# Patient Record
Sex: Male | Born: 1960 | Race: White | Hispanic: No | Marital: Married | Smoking: Former smoker
Health system: Southern US, Community
[De-identification: ages and names within clinical notes are randomized; demographics above are authoritative.]

## PROBLEM LIST (undated history)

## (undated) DIAGNOSIS — M1990 Unspecified osteoarthritis, unspecified site: Secondary | ICD-10-CM

## (undated) DIAGNOSIS — Z87442 Personal history of urinary calculi: Secondary | ICD-10-CM

## (undated) HISTORY — PX: TONSILLECTOMY: SUR1361

---

## 2011-05-10 ENCOUNTER — Telehealth: Payer: Self-pay | Admitting: Family Medicine

## 2011-05-10 NOTE — Telephone Encounter (Signed)
Pt would like to pup lab orders that have been printed out and take to lab in [place] to have drawn.  Please advise what labs need to be ordered, and will do. Plan:  Routed to Dr. Marlyne Beards, LPN [facility]

## 2011-05-10 NOTE — Telephone Encounter (Signed)
This pt has never been seen here.

## 2011-05-11 NOTE — Telephone Encounter (Signed)
LMOM for the pt instructing wife to call and let know what the request for labs is for.  Pt never seen in this office before.  No appts on file. Pending pt call back. Jarvis Newcomer, LPN [facility]

## 2011-05-12 NOTE — Telephone Encounter (Signed)
Closed

## 2012-03-15 ENCOUNTER — Ambulatory Visit: Payer: BC Managed Care – PPO | Admitting: Physician Assistant

## 2012-03-16 ENCOUNTER — Ambulatory Visit (INDEPENDENT_AMBULATORY_CARE_PROVIDER_SITE_OTHER): Payer: PRIVATE HEALTH INSURANCE | Admitting: Family Medicine

## 2012-03-16 ENCOUNTER — Encounter: Payer: Self-pay | Admitting: Family Medicine

## 2012-03-16 VITALS — BP 123/85 | HR 86 | Temp 98.2°F | Ht 74.0 in | Wt 265.0 lb

## 2012-03-16 DIAGNOSIS — R1310 Dysphagia, unspecified: Secondary | ICD-10-CM

## 2012-03-16 DIAGNOSIS — J029 Acute pharyngitis, unspecified: Secondary | ICD-10-CM

## 2012-03-16 DIAGNOSIS — D239 Other benign neoplasm of skin, unspecified: Secondary | ICD-10-CM

## 2012-03-16 LAB — POCT RAPID STREP A (OFFICE): Rapid Strep A Screen: NEGATIVE

## 2012-03-16 NOTE — Patient Instructions (Addendum)
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 If you don't here from [place] in about a week then please give [place] a call at [phone]. Take Dexilant once a day for a week or two and see if helps your symptoms or not.

## 2012-03-16 NOTE — Progress Notes (Signed)
Subjective:    Patient ID: Elijah Vargas, male    DOB: [birth year], 51 y.o.   MRN: [id number]  HPI ST x 3 months.  Constantly feels raw.  Food feels like it might get stuck.  Sometimes coughs and sometimes chokes.  No other dysphagia lower down. No hx fo throat or GI problems. No sick contacts. Wife had strep.  No fever.  Right ear had pain for a little while. But recenly had tooth pulled and no longer having ear pain. No lozenges or medicine.  No voice changes. Not a smoker.  No swelling in the neck.  No rash or fatigue. No recent heartburn symptoms. Doesn't work  Where has to talk all day or scream all days.   Lesion on arm, right - has been there for years. No change. Occ tender if squeeze it. No drainage.     Review of Systems  Constitutional: Negative for fever, diaphoresis and unexpected weight change.  HENT: Negative for hearing loss, rhinorrhea, sneezing and tinnitus.   Eyes: Negative for visual disturbance.  Respiratory: Negative for cough and wheezing.   Cardiovascular: Negative for chest pain and palpitations.  Gastrointestinal: Negative for nausea, vomiting, diarrhea and blood in stool.  Genitourinary: Negative for dysuria and discharge.  Musculoskeletal: Negative for myalgias and arthralgias.  Skin: Negative for rash.  Neurological: Negative for headaches.  Hematological: Negative for adenopathy.  Psychiatric/Behavioral: Negative for disturbed wake/sleep cycle and dysphoric mood. The patient is not nervous/anxious.    BP 123/85  Pulse 86  Temp 98.2 F (36.8 C) (Oral)  Ht 6\' 2"  (1.88 m)  Wt 265 lb (120.203 kg)  BMI 34.02 kg/m2  SpO2 97%    No Known Allergies  History reviewed. No pertinent past medical history.  Past Surgical History  Procedure Date  . Tonsillectomy     History   Social History  . Marital Status: Married    Spouse Name: Nita Sells    Number of Children: 3  . Years of Education: HS   Occupational History  . dock worker     [employer]    Social  History Main Topics  . Smoking status: Former Smoker    Quit date: 03/16/1992  . Smokeless tobacco: Not on file  . Alcohol Use: Not on file  . Drug Use: No  . Sexually Active: Yes -- Male partner(s)   Other Topics Concern  . Not on file   Social History Narrative  . No narrative on file    Family History  Problem Relation Age of Onset  . Breast cancer      grandmother  . Diabetes Mother   . Hypertension Father   . Alcohol abuse Brother     No outpatient encounter prescriptions on file as of 03/16/2012.          Objective:   Physical Exam  Constitutional: He is oriented to person, place, and time. He appears well-developed and well-nourished.  HENT:  Head: Normocephalic and atraumatic.  Right Ear: External ear normal.  Left Ear: External ear normal.  Nose: Nose normal.  Mouth/Throat: Oropharynx is clear and moist. No oropharyngeal exudate.       TMs and canals are clear. Tonsils are absent.   Eyes: Conjunctivae and EOM are normal. Pupils are equal, round, and reactive to light.  Neck: Neck supple. No thyromegaly present.  Cardiovascular: Normal rate and normal heart sounds.   Pulmonary/Chest: Effort normal and breath sounds normal.  Lymphadenopathy:    He has no cervical adenopathy.  Neurological:  He is alert and oriented to person, place, and time.  Skin: Skin is warm and dry.  Psychiatric: He has a normal mood and affect.          Assessment & Plan:  Pharyngitis - Neg for strep. Will check for CMV and EBV and CBC w/ diff. Remote smoking history. Could be a stricture or esophagitis as well since occ having dysphagia. Also consider tx for GERD with can cause ST and painful swallowing. I am concerned about the dysphasia and occasional choking.  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 Recommend benefit for him.  He says he would like to come in for physical. Impression schedule it at any point in time.  And we can certainly get blood work et Karie Soda.

## 2012-09-25 ENCOUNTER — Encounter: Payer: Self-pay | Admitting: Family Medicine

## 2012-09-25 ENCOUNTER — Ambulatory Visit (INDEPENDENT_AMBULATORY_CARE_PROVIDER_SITE_OTHER): Payer: BC Managed Care – PPO | Admitting: Family Medicine

## 2012-09-25 VITALS — BP 123/81 | HR 104 | Ht 74.0 in | Wt 271.0 lb

## 2012-09-25 DIAGNOSIS — R300 Dysuria: Secondary | ICD-10-CM

## 2012-09-25 DIAGNOSIS — R3129 Other microscopic hematuria: Secondary | ICD-10-CM

## 2012-09-25 LAB — POCT URINALYSIS DIPSTICK
Ketones, UA: NEGATIVE
Leukocytes, UA: NEGATIVE
Nitrite, UA: NEGATIVE
Protein, UA: NEGATIVE
Urobilinogen, UA: 0.2
pH, UA: 5.5

## 2012-09-25 NOTE — Patient Instructions (Addendum)
Will call you with the results

## 2012-09-25 NOTE — Progress Notes (Signed)
  Subjective:    Patient ID: Elijah Vargas, male    DOB: June 23, 1961, 52 y.o.   MRN: [id number]  HPI Microscopy hematuria. - No prior problems with kidneys.  Was told had blood in the urine a few years ago but not heard that before. Not a smoker. Quit 21 years ago.  He is not having any pains or abdominal discomfort or dysuria. No gross hematuria. No trauma. No family history of kidney problems.   Review of Systems     Objective:   Physical Exam  Constitutional: He is oriented to person, place, and time. He appears well-developed and well-nourished.  HENT:  Head: Normocephalic and atraumatic.  Cardiovascular: Normal rate, regular rhythm and normal heart sounds.   Pulmonary/Chest: Effort normal and breath sounds normal.  Abdominal: Soft. Bowel sounds are normal. He exhibits no distension and no mass. There is no tenderness. There is no rebound and no guarding.  Neurological: He is alert and oriented to person, place, and time.  Skin: Skin is warm and dry.  Psychiatric: He has a normal mood and affect. His behavior is normal.          Assessment & Plan:  Microscopic hematuria - unclear etiology at this point. Urinalysis was positive for small amount of blood. We'll send for microscopic evaluation to actually count the number of red blood cells in addition to urine culture. We will call with those results are available. They were to proceed with additional workup for hematuria.

## 2012-09-28 LAB — URINALYSIS, MICROSCOPIC ONLY
Bacteria, UA: NONE SEEN
Crystals: NONE SEEN

## 2012-09-29 LAB — URINE CULTURE: Colony Count: NO GROWTH

## 2013-12-14 ENCOUNTER — Emergency Department
Admission: EM | Admit: 2013-12-14 | Discharge: 2013-12-14 | Discharge status: Absent without leave | Payer: BC Managed Care – PPO | Source: Home / Self Care

## 2013-12-14 NOTE — ED Notes (Signed)
Foreign object in eye fell out before triage. Pt decided he did not need to be seen.

## 2014-04-15 ENCOUNTER — Ambulatory Visit (INDEPENDENT_AMBULATORY_CARE_PROVIDER_SITE_OTHER): Payer: BC Managed Care – PPO

## 2014-04-15 ENCOUNTER — Ambulatory Visit (INDEPENDENT_AMBULATORY_CARE_PROVIDER_SITE_OTHER): Payer: BC Managed Care – PPO | Admitting: Family Medicine

## 2014-04-15 ENCOUNTER — Encounter: Payer: Self-pay | Admitting: Family Medicine

## 2014-04-15 VITALS — BP 134/91 | HR 77 | Ht 74.0 in | Wt 271.0 lb

## 2014-04-15 DIAGNOSIS — M47817 Spondylosis without myelopathy or radiculopathy, lumbosacral region: Secondary | ICD-10-CM

## 2014-04-15 DIAGNOSIS — R5383 Other fatigue: Principal | ICD-10-CM

## 2014-04-15 DIAGNOSIS — M25559 Pain in unspecified hip: Secondary | ICD-10-CM

## 2014-04-15 DIAGNOSIS — R5381 Other malaise: Secondary | ICD-10-CM

## 2014-04-15 DIAGNOSIS — M545 Low back pain, unspecified: Secondary | ICD-10-CM

## 2014-04-15 DIAGNOSIS — Z125 Encounter for screening for malignant neoplasm of prostate: Secondary | ICD-10-CM

## 2014-04-15 MED ORDER — MELOXICAM 15 MG PO TABS: 15.0000 mg | ORAL_TABLET | Freq: Every day | ORAL | Status: DC

## 2014-04-15 MED ORDER — CYCLOBENZAPRINE HCL 10 MG PO TABS: 10.0000 mg | ORAL_TABLET | Freq: Three times a day (TID) | ORAL | Status: DC | PRN

## 2014-04-15 NOTE — Progress Notes (Signed)
Subjective:    Patient ID: Elijah Vargas, male    DOB: 08-07-1961, 53 y.o.   MRN: [id number]  Back Pain  Hip Pain    Back pain started about 1 mo ago. Pain 3-4/10, sharp and constant.  Mostly on his left side. 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  Using Tylenol and IBU for pain relief. No ice or heat.  Has been having some let hip pain for several months. At its worst is is 8/10.  No pain with urination.  No radiation into the leg.  His hip used to lock and pop. No old injuries.    He is fasting today.    His wife also requests that he have some routine blood work including cholesterol etc. She would also like to have his testosterone checked because she says she's been very fatigued and she feels like he's noticed a significant decrease in his strength of the last year.   Review of Systems  Musculoskeletal: Positive for back pain.   BP 134/91  Pulse 77  Ht 6\' 2"  (1.88 m)  Wt 271 lb (122.925 kg)  BMI 34.78 kg/m2    No Known Allergies  No past medical history on file.  Past Surgical History  Procedure Laterality Date  . Tonsillectomy      History   Social History  . Marital Status: Married    Spouse Name: Velta Addison    Number of Children: 3  . Years of Education: HS   Occupational History  . dock worker     [employer]    Social History Main Topics  . Smoking status: Former Smoker    Quit date: 03/16/1992  . Smokeless tobacco: Not on file  . Alcohol Use: Not on file  . Drug Use: No  . Sexual Activity: Yes    Partners: Female   Other Topics Concern  . Not on file   Social History Narrative  . No narrative on file    Family History  Problem Relation Age of Onset  . Breast cancer      grandmother  . Diabetes Mother   . Hypertension Father   . Alcohol abuse Brother     Outpatient Encounter Prescriptions as of 04/15/2014  Medication Sig  . cyclobenzaprine (FLEXERIL) 10 MG tablet Take 1 tablet (10 mg total) by mouth 3 (three) times daily as needed for  muscle spasms.  . meloxicam (MOBIC) 15 MG tablet Take 1 tablet (15 mg total) by mouth daily.          Objective:   Physical Exam  Constitutional: He is oriented to person, place, and time. He appears well-developed and well-nourished.  HENT:  Head: Normocephalic and atraumatic.  Cardiovascular: Normal rate, regular rhythm and normal heart sounds.   Pulmonary/Chest: Effort normal and breath sounds normal.  Musculoskeletal:  Lumbar spine is nontender but I am able to palpate a spasm of the paraspinous muscles on the left side. A little bit of SI joint tenderness as well. Normal flexion, extension, rotation right and left. He had slight decreased rotation with side bending to the left because of discomfort. Negative straight leg raise bilaterally. Hip, knee, ankle strength out of 5 bilaterally. Patellar reflexes are 2+. Nontender over the greater trochanter bilaterally. Is a little bit weaker with hip abduction against resistance on the left compared to the right. No significant discomfort with internal or external rotation of the hips.  Neurological: He is alert and oriented to person, place, and time.  Skin:  Skin is warm and dry.  Psychiatric: He has a normal mood and affect. His behavior is normal.          Assessment & Plan:  Bilateral hip pain- unclear etiology but suspect possibly a labral tear based on his history and symptoms. Is unable to reproduce his pain today. I do not think it's bursitis there he does say case he has pain in the hips with sleeping on his side. Recommend an anti-inflammatory, heat/ice, and we'll get x-rays of the hip pain is been going on for months and he actually gets walking.  Low back pain- most consistant with MSK strain.  Will treat with a heating pad, NSAID, and muscle relaxer.  Handout provided for home stretches.    Due for screening lipid and CMP-strongly encouraged him to come back for complete physical.  Fatigue-unclear etiology. We did not go  into a lot of detail about this today. Since he was an acute slot. He did score positive for 5/10 questions for low testosterone and would like to have this checked today.  Declined flu vaccine today.

## 2014-04-16 LAB — CBC
HEMATOCRIT: 48 % (ref 39.0–52.0)
Hemoglobin: 16.5 g/dL (ref 13.0–17.0)
MCH: 29.3 pg (ref 26.0–34.0)
MCHC: 34.4 g/dL (ref 30.0–36.0)
MCV: 85.1 fL (ref 78.0–100.0)
Platelets: 270 10*3/uL (ref 150–400)
RBC: 5.64 MIL/uL (ref 4.22–5.81)
RDW: 14.5 % (ref 11.5–15.5)
WBC: 7.7 10*3/uL (ref 4.0–10.5)

## 2014-04-16 LAB — COMPLETE METABOLIC PANEL WITH GFR
ALBUMIN: 4.6 g/dL (ref 3.5–5.2)
ALK PHOS: 60 U/L (ref 39–117)
ALT: 27 U/L (ref 0–53)
AST: 31 U/L (ref 0–37)
BUN: 16 mg/dL (ref 6–23)
CALCIUM: 9 mg/dL (ref 8.4–10.5)
CO2: 26 mEq/L (ref 19–32)
Chloride: 101 mEq/L (ref 96–112)
Creat: 0.97 mg/dL (ref 0.50–1.35)
GFR, EST NON AFRICAN AMERICAN: 89 mL/min
GFR, Est African American: >89 mL/min
GLUCOSE: 90 mg/dL (ref 70–99)
POTASSIUM: 4.1 meq/L (ref 3.5–5.3)
Sodium: 135 mEq/L (ref 135–145)
Total Bilirubin: 0.7 mg/dL (ref 0.2–1.2)
Total Protein: 7.2 g/dL (ref 6.0–8.3)

## 2014-04-16 LAB — TESTOSTERONE, FREE, TOTAL, SHBG
Sex Hormone Binding: 20 nmol/L (ref 13–71)
TESTOSTERONE FREE: 54.6 pg/mL (ref 47.0–244.0)
Testosterone-% Free: 2.5 % (ref 1.6–2.9)
Testosterone: 220 ng/dL — ABNORMAL LOW (ref 300–890)

## 2014-04-16 LAB — LIPID PANEL
Cholesterol: 221 mg/dL — ABNORMAL HIGH (ref 0–200)
HDL: 43 mg/dL (ref >39)
LDL CALC: 123 mg/dL — AB (ref 0–99)
Total CHOL/HDL Ratio: 5.1 Ratio
Triglycerides: 273 mg/dL — ABNORMAL HIGH (ref <150)
VLDL: 55 mg/dL — AB (ref 0–40)

## 2014-04-16 LAB — TSH: TSH: 2.174 u[IU]/mL (ref 0.350–4.500)

## 2014-04-16 LAB — PSA: PSA: 0.31 ng/mL (ref <=4.00)

## 2014-06-06 ENCOUNTER — Other Ambulatory Visit: Payer: Self-pay | Admitting: Family Medicine

## 2014-06-06 DIAGNOSIS — R7989 Other specified abnormal findings of blood chemistry: Secondary | ICD-10-CM

## 2014-06-07 LAB — TESTOSTERONE, FREE, TOTAL, SHBG
Sex Hormone Binding: 21 nmol/L (ref 13–71)
Testosterone, Free: 42.9 pg/mL — ABNORMAL LOW (ref 47.0–244.0)
Testosterone-% Free: 2.4 % (ref 1.6–2.9)
Testosterone: 179 ng/dL — ABNORMAL LOW (ref 300–890)

## 2016-12-10 ENCOUNTER — Telehealth: Payer: Self-pay | Admitting: *Deleted

## 2016-12-10 DIAGNOSIS — Z125 Encounter for screening for malignant neoplasm of prostate: Secondary | ICD-10-CM

## 2016-12-10 DIAGNOSIS — Z0000 Encounter for general adult medical examination without abnormal findings: Secondary | ICD-10-CM

## 2016-12-10 NOTE — Telephone Encounter (Signed)
Pt's wife called and stated that she had spoken w/Dr. Madilyn Fireman at her [facility] and asked if she would order his labs so that he would not have to take so much time off work going back and forth. Dr. Madilyn Fireman told her that she would do this for him.  She said that he showed up at the lab this morning and they don't have is labs. So he came upstairs and spoke w/someone up front she doesn't know who and was told that Dr. Madilyn Fireman would not release the labs. I called her back and lvm apologizing for this and told her that I would get this resolved for her. Maryruth Eve, Lahoma Crocker

## 2016-12-13 LAB — COMPREHENSIVE METABOLIC PANEL
AG Ratio: 1.5 Ratio (ref 1.0–2.5)
ALBUMIN: 4.4 g/dL (ref 3.6–5.1)
ALT: 21 U/L (ref 9–46)
AST: 18 U/L (ref 10–35)
Alkaline Phosphatase: 71 U/L (ref 40–115)
BILIRUBIN TOTAL: 0.4 mg/dL (ref 0.2–1.2)
BUN/Creatinine Ratio: 13.4 Ratio (ref 6–22)
BUN: 17 mg/dL (ref 7–25)
CALCIUM: 9.8 mg/dL (ref 8.6–10.3)
CHLORIDE: 104 mmol/L (ref 98–110)
CO2: 20 mmol/L (ref 20–31)
CREATININE: 1.27 mg/dL (ref 0.70–1.33)
GFR, EST AFRICAN AMERICAN: 73 mL/min (ref >=60)
GFR, Est Non African American: 63 mL/min (ref >=60)
GLOBULIN: 2.9 g/dL (ref 1.9–3.7)
GLUCOSE: 112 mg/dL — AB (ref 65–99)
Potassium: 4.5 mmol/L (ref 3.5–5.3)
Sodium: 141 mmol/L (ref 135–146)
TOTAL PROTEIN: 7.3 g/dL (ref 6.1–8.1)

## 2016-12-13 LAB — CBC
HCT: 48.5 % (ref 38.5–50.0)
Hemoglobin: 16.3 g/dL (ref 13.2–17.1)
MCH: 28.7 pg (ref 27.0–33.0)
MCHC: 33.6 g/dL (ref 32.0–36.0)
MCV: 85.5 fL (ref 80.0–100.0)
MPV: 10.2 fL (ref 7.5–12.5)
PLATELETS: 304 10*3/uL (ref 140–400)
RBC: 5.67 MIL/uL (ref 4.20–5.80)
RDW: 14.3 % (ref 11.0–15.0)
WBC: 9 10*3/uL (ref 3.8–10.8)

## 2016-12-13 LAB — LIPID PANEL W/REFLEX DIRECT LDL
Cholesterol: 209 mg/dL — ABNORMAL HIGH (ref <200)
HDL: 43 mg/dL (ref >40)
LDL-CHOLESTEROL: 128 mg/dL — AB
Non-HDL Cholesterol (Calc): 166 mg/dL — ABNORMAL HIGH (ref <130)
Total CHOL/HDL Ratio: 4.9 Ratio (ref <5.0)
Triglycerides: 250 mg/dL — ABNORMAL HIGH (ref <150)

## 2016-12-13 LAB — PSA: PSA: 0.3 ng/mL (ref <=4.0)

## 2016-12-13 LAB — TSH: TSH: 1.71 m[IU]/L (ref 0.40–4.50)

## 2016-12-14 NOTE — Telephone Encounter (Signed)
All labs are normal. 

## 2017-01-05 ENCOUNTER — Other Ambulatory Visit: Payer: Self-pay | Admitting: Family Medicine

## 2017-01-05 DIAGNOSIS — Z0000 Encounter for general adult medical examination without abnormal findings: Secondary | ICD-10-CM

## 2017-01-07 ENCOUNTER — Encounter: Payer: Self-pay | Admitting: Family Medicine

## 2017-01-07 ENCOUNTER — Ambulatory Visit (INDEPENDENT_AMBULATORY_CARE_PROVIDER_SITE_OTHER): Payer: BLUE CROSS/BLUE SHIELD | Admitting: Family Medicine

## 2017-01-07 VITALS — BP 127/80 | HR 78 | Wt 276.0 lb

## 2017-01-07 DIAGNOSIS — E291 Testicular hypofunction: Secondary | ICD-10-CM

## 2017-01-07 DIAGNOSIS — R7301 Impaired fasting glucose: Secondary | ICD-10-CM

## 2017-01-07 DIAGNOSIS — R7309 Other abnormal glucose: Secondary | ICD-10-CM | POA: Diagnosis not present

## 2017-01-07 DIAGNOSIS — S76211A Strain of adductor muscle, fascia and tendon of right thigh, initial encounter: Secondary | ICD-10-CM

## 2017-01-07 DIAGNOSIS — Z0000 Encounter for general adult medical examination without abnormal findings: Secondary | ICD-10-CM

## 2017-01-07 LAB — TESTOSTERONE: TESTOSTERONE: 165 ng/dL — AB (ref 250–827)

## 2017-01-07 LAB — POCT GLYCOSYLATED HEMOGLOBIN (HGB A1C): HEMOGLOBIN A1C: 6

## 2017-01-07 NOTE — Patient Instructions (Addendum)
 Health Maintenance, Male A healthy lifestyle and preventive care is important for your health and wellness. Ask your health care provider about what schedule of regular examinations is right for you. What should I know about weight and diet?  Eat a Healthy Diet  Eat plenty of vegetables, fruits, whole grains, low-fat dairy products, and lean protein.  Do not eat a lot of foods high in solid fats, added sugars, or salt. Maintain a Healthy Weight  Regular exercise can help you achieve or maintain a healthy weight. You should:  Do at least 150 minutes of exercise each week. The exercise should increase your heart rate and make you sweat (moderate-intensity exercise).  Do strength-training exercises at least twice a week. Watch Your Levels of Cholesterol and Blood Lipids  Have your blood tested for lipids and cholesterol every 5 years starting at 55 years of age. If you are at high risk for heart disease, you should start having your blood tested when you are 55 years old. You may need to have your cholesterol levels checked more often if:  Your lipid or cholesterol levels are high.  You are older than 55 years of age.  You are at high risk for heart disease. What should I know about cancer screening? Many types of cancers can be detected early and may often be prevented. Lung Cancer  You should be screened every year for lung cancer if:  You are a current smoker who has smoked for at least 30 years.  You are a former smoker who has quit within the past 15 years.  Talk to your health care provider about your screening options, when you should start screening, and how often you should be screened. Colorectal Cancer  Routine colorectal cancer screening usually begins at 55 years of age and should be repeated every 5-10 years until you are 55 years old. You may need to be screened more often if early forms of precancerous polyps or small growths are found. Your health care provider  may recommend screening at an earlier age if you have risk factors for colon cancer.  Your health care provider may recommend using home test kits to check for hidden blood in the stool.  A small camera at the end of a tube can be used to examine your colon (sigmoidoscopy or colonoscopy). This checks for the earliest forms of colorectal cancer. Prostate and Testicular Cancer  Depending on your age and overall health, your health care provider may do certain tests to screen for prostate and testicular cancer.  Talk to your health care provider about any symptoms or concerns you have about testicular or prostate cancer. Skin Cancer  Check your skin from head to toe regularly.  Tell your health care provider about any new moles or changes in moles, especially if:  There is a change in a mole's size, shape, or color.  You have a mole that is larger than a pencil eraser.  Always use sunscreen. Apply sunscreen liberally and repeat throughout the day.  Protect yourself by wearing long sleeves, pants, a wide-brimmed hat, and sunglasses when outside. What should I know about heart disease, diabetes, and high blood pressure?  If you are 18-39 years of age, have your blood pressure checked every 3-5 years. If you are 40 years of age or older, have your blood pressure checked every year. You should have your blood pressure measured twice-once when you are at a hospital or clinic, and once when you are not at   a hospital or clinic. Record the average of the two measurements. To check your blood pressure when you are not at a hospital or clinic, you can use:  An automated blood pressure machine at a pharmacy.  A home blood pressure monitor.  Talk to your health care provider about your target blood pressure.  If you are between 45-79 years old, ask your health care provider if you should take aspirin to prevent heart disease.  Have regular diabetes screenings by checking your fasting blood sugar  level.  If you are at a normal weight and have a low risk for diabetes, have this test once every three years after the age of 45.  If you are overweight and have a high risk for diabetes, consider being tested at a younger age or more often.  A one-time screening for abdominal aortic aneurysm (AAA) by ultrasound is recommended for men aged 65-75 years who are current or former smokers. What should I know about preventing infection? Hepatitis B  If you have a higher risk for hepatitis B, you should be screened for this virus. Talk with your health care provider to find out if you are at risk for hepatitis B infection. Hepatitis C  Blood testing is recommended for:  Everyone born from 1945 through 1965.  Anyone with known risk factors for hepatitis C. Sexually Transmitted Diseases (STDs)  You should be screened each year for STDs including gonorrhea and chlamydia if:  You are sexually active and are younger than 55 years of age.  You are older than 55 years of age and your health care provider tells you that you are at risk for this type of infection.  Your sexual activity has changed since you were last screened and you are at an increased risk for chlamydia or gonorrhea. Ask your health care provider if you are at risk.  Talk with your health care provider about whether you are at high risk of being infected with HIV. Your health care provider may recommend a prescription medicine to help prevent HIV infection. What else can I do?  Schedule regular health, dental, and eye exams.  Stay current with your vaccines (immunizations).  Do not use any tobacco products, such as cigarettes, chewing tobacco, and e-cigarettes. If you need help quitting, ask your health care provider.  Limit alcohol intake to no more than 2 drinks per day. One drink equals 12 ounces of beer, 5 ounces of wine, or 1 ounces of hard liquor.  Do not use street drugs.  Do not share needles.  Ask your health  care provider for help if you need support or information about quitting drugs.  Tell your health care provider if you often feel depressed.  Tell your health care provider if you have ever been abused or do not feel safe at home. This information is not intended to replace advice given to you by your health care provider. Make sure you discuss any questions you have with your health care provider. Document Released: 01/29/2008 Document Revised: 03/31/2016 Document Reviewed: 05/06/2015 Elsevier Interactive Patient Education  2017 [employer] Inc[employer]  

## 2017-01-07 NOTE — Progress Notes (Signed)
Subjective:    Patient ID: Elijah Vargas, male    DOB: 26-Oct-1960, 56 y.o.   MRN: [id number]  HPI 56 year old male is here today for complete physical exam and to go over recent lab work.  Unfortunately his glucose was elevated so we wanted to do a hemoglobin A1c on him today.No regular exercise but he does have a very physically active job.   Hypogonadism -  He also wanted to discuss his low testosterone levels.   He also complains of right groin pain. He had an injury at work and ever since then has had some discomfort along the right groin crease. He denies any bulging or swelling directly in that area. He is able to walk on it without any difficulty. He did see their provider through work and was put on light duty. He really wants to return to full work. He really doesn't take any medications for it.  Review of Systems  Comprehensive review of systems is negative except for what is noted in the history of present illness.   BP 127/80   Pulse 78   Wt 276 lb (125.2 kg)   BMI 35.44 kg/m     No Known Allergies  No past medical history on file.  Past Surgical History:  Procedure Laterality Date  . TONSILLECTOMY      Social History   Social History  . Marital status: Married    Spouse name: MaryAnn  . Number of children: 3  . Years of education: HS   Occupational History  . dock worker     [employer]    Social History Main Topics  . Smoking status: Former Smoker    Quit date: 03/16/1992  . Smokeless tobacco: Never Used  . Alcohol use No  . Drug use: No  . Sexual activity: Yes    Partners: Female   Other Topics Concern  . Not on file   Social History Narrative  . No narrative on file    Family History  Problem Relation Age of Onset  . Breast cancer Unknown        grandmother  . Diabetes Mother   . Hypertension Father   . Alcohol abuse Brother     Outpatient Encounter Prescriptions as of 01/07/2017  Medication Sig  . [DISCONTINUED] cyclobenzaprine  (FLEXERIL) 10 MG tablet Take 1 tablet (10 mg total) by mouth 3 (three) times daily as needed for muscle spasms.  . [DISCONTINUED] meloxicam (MOBIC) 15 MG tablet Take 1 tablet (15 mg total) by mouth daily.   No facility-administered encounter medications on file as of 01/07/2017.           Objective:   Physical Exam  Constitutional: He is oriented to person, place, and time. He appears well-developed and well-nourished.  HENT:  Head: Normocephalic and atraumatic.  Right Ear: External ear normal.  Left Ear: External ear normal.  Nose: Nose normal.  Mouth/Throat: Oropharynx is clear and moist.  Eyes: Conjunctivae and EOM are normal. Pupils are equal, round, and reactive to light.  Neck: Normal range of motion. Neck supple. No thyromegaly present.  Cardiovascular: Normal rate, regular rhythm, normal heart sounds and intact distal pulses.   Pulmonary/Chest: Effort normal and breath sounds normal.  Abdominal: Soft. Bowel sounds are normal. He exhibits no distension and no mass. There is no tenderness. There is no rebound and no guarding.  Musculoskeletal: Normal range of motion.  Lymphadenopathy:    He has no cervical adenopathy.  Neurological: He is alert and oriented  to person, place, and time. He has normal reflexes.  Skin: Skin is warm and dry.  Psychiatric: He has a normal mood and affect. His behavior is normal. Judgment and thought content normal.   Right hip with normal range of motion. Strength is 5 out of 5 at the hip knee and ankle. Patellar reflexes 1+ bilaterally. No significant discomfort with internal or external rotation of the hip.     Assessment & Plan:  CPE Keep up a regular exercise program and make sure you are eating a healthy diet Try to eat 4 servings of dairy a day, or if you are lactose intolerant take a calcium with vitamin D daily.  Your vaccines are up to date.   Abnormal glucose-Did check a hemoglobin A1c today and it was 6.0 in the impaired fasting  glucose range. Discussed cutting back on sugary drinks. He turns a lot of sweet tea that he did recently cut out soda. He also eats a lot of chocolate and sweets and we discussed cutting that back as well. Increasing vegetables in the diet and reducing portion sizes on carbohydrates. I referred him and his wife to the American diabetic Association website for additional information. I'm to see him back in 4 months. Did encourage him to start exercising again. He was wife were walking nightly at one point in time that he had an injury at work and injured his knee and has not been able to it since then.  Hypogonadism-Assess his diagnosis. We discussed potential symptoms of having low testosterone. We also discussed the increased risk of cardiac disease with supplementation. Encouraged him to think about it. If he is interested in the can schedule appointment to go over different options for treatment available.  Right groin strain - given handout with exercises to do on his own at home. If not improving over the next 3 weeks and encouraged him to get in with one of our sports medicine providers for further workup.

## 2017-05-11 ENCOUNTER — Encounter: Payer: Self-pay | Admitting: Family Medicine

## 2017-05-11 ENCOUNTER — Ambulatory Visit (INDEPENDENT_AMBULATORY_CARE_PROVIDER_SITE_OTHER): Payer: BLUE CROSS/BLUE SHIELD | Admitting: Family Medicine

## 2017-05-11 VITALS — BP 104/73 | HR 75 | Ht 74.0 in | Wt 241.0 lb

## 2017-05-11 DIAGNOSIS — R7309 Other abnormal glucose: Secondary | ICD-10-CM | POA: Diagnosis not present

## 2017-05-11 DIAGNOSIS — Z1211 Encounter for screening for malignant neoplasm of colon: Secondary | ICD-10-CM | POA: Diagnosis not present

## 2017-05-11 DIAGNOSIS — Z23 Encounter for immunization: Secondary | ICD-10-CM

## 2017-05-11 DIAGNOSIS — R7301 Impaired fasting glucose: Secondary | ICD-10-CM | POA: Diagnosis not present

## 2017-05-11 LAB — POCT GLYCOSYLATED HEMOGLOBIN (HGB A1C): HEMOGLOBIN A1C: 5.8

## 2017-05-11 NOTE — Progress Notes (Signed)
   Subjective:    Patient ID: Elijah Vargas, male    DOB: [birth year], 56 y.o.   MRN: [id number]  HPI Impaired fasting glucose-no increased thirst or urination. No symptoms consistent with hypoglycemia. He is doing well overall. He has really cut out junk food.  He is only drinking water.  He has lost 35 lbs.    Would like to get Tdap done today.    Review of Systems  BP 104/73   Pulse 75   Ht 6\' 2"  (1.88 m)   Wt 241 lb (109.3 kg)   SpO2 97%   BMI 30.94 kg/m     No Known Allergies  No past medical history on file.  Past Surgical History:  Procedure Laterality Date  . TONSILLECTOMY      Social History   Social History  . Marital status: Married    Spouse name: MaryAnn  . Number of children: 3  . Years of education: HS   Occupational History  . dock worker     [employer]    Social History Main Topics  . Smoking status: Former Smoker    Quit date: 03/16/1992  . Smokeless tobacco: Never Used  . Alcohol use No  . Drug use: No  . Sexual activity: Yes    Partners: Female   Other Topics Concern  . Not on file   Social History Narrative  . No narrative on file    Family History  Problem Relation Age of Onset  . Breast cancer Unknown        grandmother  . Diabetes Mother   . Hypertension Father   . Alcohol abuse Brother     No outpatient encounter prescriptions on file as of 05/11/2017.   No facility-administered encounter medications on file as of 05/11/2017.          Objective:   Physical Exam  Constitutional: He is oriented to person, place, and time. He appears well-developed and well-nourished.  HENT:  Head: Normocephalic and atraumatic.  Cardiovascular: Normal rate, regular rhythm and normal heart sounds.   Pulmonary/Chest: Effort normal and breath sounds normal.  Neurological: He is alert and oriented to person, place, and time.  Skin: Skin is warm and dry.  Psychiatric: He has a normal mood and affect. His behavior is normal.       Assessment &  Plan:  IFG - Well controlled. He gradually get him on lifestyle changes. Continue current regimen. Follow up in  6 months.   Lab Results  Component Value Date   HGBA1C 5.8 05/11/2017    Discussed need for colon cancer screening.  After much discussion he opted to get set up for colonoscopy. I'll go ahead and place referral today.

## 2017-11-08 ENCOUNTER — Encounter: Payer: Self-pay | Admitting: Family Medicine

## 2017-11-08 ENCOUNTER — Ambulatory Visit: Payer: BLUE CROSS/BLUE SHIELD | Admitting: Family Medicine

## 2017-11-08 ENCOUNTER — Ambulatory Visit (INDEPENDENT_AMBULATORY_CARE_PROVIDER_SITE_OTHER): Payer: BLUE CROSS/BLUE SHIELD

## 2017-11-08 VITALS — BP 115/70 | HR 77 | Ht 74.0 in | Wt 242.0 lb

## 2017-11-08 DIAGNOSIS — R7301 Impaired fasting glucose: Secondary | ICD-10-CM

## 2017-11-08 DIAGNOSIS — M25551 Pain in right hip: Secondary | ICD-10-CM

## 2017-11-08 DIAGNOSIS — Z1211 Encounter for screening for malignant neoplasm of colon: Secondary | ICD-10-CM | POA: Diagnosis not present

## 2017-11-08 DIAGNOSIS — G8929 Other chronic pain: Secondary | ICD-10-CM

## 2017-11-08 LAB — POCT GLYCOSYLATED HEMOGLOBIN (HGB A1C): HEMOGLOBIN A1C: 5.8

## 2017-11-08 NOTE — Progress Notes (Signed)
Subjective:    Patient ID: Elijah Vargas, male    DOB: 01-28-1961, 57 y.o.   MRN: [id number]  HPI  Impaired fasting glucose-no increased thirst or urination. No symptoms consistent with hypoglycemia.  He also complains of right hip pain.  It has been bothering him for almost a year.  He actually fell between the dog in his trailer.  The time he injured his knee and his right hip.  But his right hip has continued to bother him.  After the accident he was seen through work and had an x-ray which she was told was negative.  He says it seems to be more positional particularly if he tries to turn or twist he will get a pain along that right hip groin area.  He does not really take medications for at least not consistently.  He occasionally have a little bit of pain on the outer portion of the hip as well.  He says sometimes will notice he is even limping.  In regards to colon cancer screening he did get a couple of phone calls last year to schedule but says he just was so busy he did not get a chance to do it.  He will try to get it done this year.  Review of Systems  BP 115/70   Pulse 77   Ht 6\' 2"  (1.88 m)   Wt 242 lb (109.8 kg)   SpO2 100%   BMI 31.07 kg/m     No Known Allergies  No past medical history on file.  Past Surgical History:  Procedure Laterality Date  . TONSILLECTOMY      Social History   Socioeconomic History  . Marital status: Married    Spouse name: MaryAnn  . Number of children: 3  . Years of education: HS  . Highest education level: Not on file  Occupational History  . Occupation: dock Insurance underwriter    Comment: [employer]   Social Needs  . Financial resource strain: Not on file  . Food insecurity:    Worry: Not on file    Inability: Not on file  . Transportation needs:    Medical: Not on file    Non-medical: Not on file  Tobacco Use  . Smoking status: Former Smoker    Last attempt to quit: 03/16/1992    Years since quitting: 25.6  . Smokeless tobacco: Never  Used  Substance and Sexual Activity  . Alcohol use: No  . Drug use: No  . Sexual activity: Yes    Partners: Female  Lifestyle  . Physical activity:    Days per week: Not on file    Minutes per session: Not on file  . Stress: Not on file  Relationships  . Social connections:    Talks on phone: Not on file    Gets together: Not on file    Attends religious service: Not on file    Active member of club or organization: Not on file    Attends meetings of clubs or organizations: Not on file    Relationship status: Not on file  . Intimate partner violence:    Fear of current or ex partner: Not on file    Emotionally abused: Not on file    Physically abused: Not on file    Forced sexual activity: Not on file  Other Topics Concern  . Not on file  Social History Narrative  . Not on file    Family History  Problem Relation Age of Onset  .  Breast cancer Unknown        grandmother  . Diabetes Mother   . Hypertension Father   . Alcohol abuse Brother     No outpatient encounter medications on file as of 11/08/2017.   No facility-administered encounter medications on file as of 11/08/2017.          Objective:   Physical Exam  Constitutional: He is oriented to person, place, and time. He appears well-developed and well-nourished.  HENT:  Head: Normocephalic and atraumatic.  Cardiovascular: Normal rate, regular rhythm and normal heart sounds.  Pulmonary/Chest: Effort normal and breath sounds normal.  Musculoskeletal:  Right hip with normal flexion and extension.  Discomfort of the right groin crease with internal rotation.  Nontender over the greater trochanter.  Hip, knee strength is 5 out of 5 bilaterally.  Neurological: He is alert and oriented to person, place, and time.  Skin: Skin is warm and dry.  Psychiatric: He has a normal mood and affect. His behavior is normal.       Assessment & Plan:  IFG - Well controlled. Continue current regimen. Follow up in  76months.     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  The gait is also have an injury to the soft tissue or cartilage.  Especially if he had persistent pain for a year.  Like to go ahead and get repeat x-rays today and likely will get him in with sports medicine.  Colon cancer screening -he says he would like to do colonoscopy.  He just never got around to doing it last year.  He says he liked their phone number to be able to call and schedule it.

## 2017-11-08 NOTE — Patient Instructions (Signed)
[facility] at [phone] to schedule your colonoscopy.

## 2017-11-11 LAB — COMPLETE METABOLIC PANEL WITH GFR
AG Ratio: 1.7 (calc) (ref 1.0–2.5)
ALKALINE PHOSPHATASE (APISO): 63 U/L (ref 40–115)
ALT: 12 U/L (ref 9–46)
AST: 15 U/L (ref 10–35)
Albumin: 4.2 g/dL (ref 3.6–5.1)
BILIRUBIN TOTAL: 0.6 mg/dL (ref 0.2–1.2)
BUN: 20 mg/dL (ref 7–25)
CHLORIDE: 107 mmol/L (ref 98–110)
CO2: 27 mmol/L (ref 20–32)
Calcium: 9.3 mg/dL (ref 8.6–10.3)
Creat: 1.11 mg/dL (ref 0.70–1.33)
GFR, EST AFRICAN AMERICAN: 86 mL/min/{1.73_m2} (ref >=60)
GFR, Est Non African American: 74 mL/min/{1.73_m2} (ref >=60)
GLUCOSE: 100 mg/dL — AB (ref 65–99)
Globulin: 2.5 g/dL (calc) (ref 1.9–3.7)
Potassium: 4.5 mmol/L (ref 3.5–5.3)
Sodium: 140 mmol/L (ref 135–146)
TOTAL PROTEIN: 6.7 g/dL (ref 6.1–8.1)

## 2017-11-11 LAB — LIPID PANEL
Cholesterol: 203 mg/dL — ABNORMAL HIGH (ref <200)
HDL: 47 mg/dL (ref >40)
LDL CHOLESTEROL (CALC): 127 mg/dL — AB
NON-HDL CHOLESTEROL (CALC): 156 mg/dL — AB (ref <130)
TRIGLYCERIDES: 172 mg/dL — AB (ref <150)
Total CHOL/HDL Ratio: 4.3 (calc) (ref <5.0)

## 2017-11-11 LAB — PSA: PSA: 0.3 ng/mL (ref <=4.0)

## 2018-05-11 ENCOUNTER — Ambulatory Visit: Payer: BLUE CROSS/BLUE SHIELD | Admitting: Family Medicine

## 2018-05-22 ENCOUNTER — Encounter: Payer: Self-pay | Admitting: Family Medicine

## 2018-05-22 ENCOUNTER — Ambulatory Visit (INDEPENDENT_AMBULATORY_CARE_PROVIDER_SITE_OTHER): Payer: BLUE CROSS/BLUE SHIELD | Admitting: Family Medicine

## 2018-05-22 VITALS — BP 108/72 | HR 72 | Ht 74.0 in | Wt 237.0 lb

## 2018-05-22 DIAGNOSIS — R7301 Impaired fasting glucose: Secondary | ICD-10-CM

## 2018-05-22 DIAGNOSIS — N200 Calculus of kidney: Secondary | ICD-10-CM | POA: Diagnosis not present

## 2018-05-22 LAB — POCT GLYCOSYLATED HEMOGLOBIN (HGB A1C): Hemoglobin A1C: 5.6 % (ref 4.0–5.6)

## 2018-05-22 NOTE — Progress Notes (Signed)
Subjective:    CC: glucose  HPI:  Impaired fasting glucose-no increased thirst or urination. No symptoms consistent with hypoglycemia. He has changed his diet.  He has really cut back  And had switched to water.    He did have a kidney stone since I last saw him in July. He is doing well. He was on tamsulosin for a short time.  He is doing much better.     Past medical history, Surgical history, Family history not pertinant except as noted below, Social history, Allergies, and medications have been entered into the medical record, reviewed, and corrections made.   Review of Systems: No fevers, chills, night sweats, weight loss, chest pain, or shortness of breath.   Objective:    General: Well Developed, well nourished, and in no acute distress.  Neuro: Alert and oriented x3, extra-ocular muscles intact, sensation grossly intact.  HEENT: Normocephalic, atraumatic  Skin: Warm and dry, no rashes. Cardiac: Regular rate and rhythm, no murmurs rubs or gallops, no lower extremity edema.  Respiratory: Clear to auscultation bilaterally. Not using accessory muscles, speaking in full sentences.   Impression and Recommendations:    IFG - Well controlled. Continue current regimen. Follow up in  12 months since A1C is back in the normal range.    Kidney stones - Following with Urology.

## 2019-04-16 LAB — HM COLONOSCOPY

## 2019-05-04 ENCOUNTER — Encounter: Payer: Self-pay | Admitting: Family Medicine

## 2019-05-23 ENCOUNTER — Ambulatory Visit: Payer: BLUE CROSS/BLUE SHIELD | Admitting: Family Medicine

## 2019-05-23 NOTE — Progress Notes (Deleted)
Established Patient Office Visit  Subjective:  Patient ID: Elijah Vargas, male    DOB: [birth year]  Age: 58 y.o. MRN: [id number]  CC: No chief complaint on file.   HPI [name] presents for   Impaired fasting glucose-no increased thirst or urination. No symptoms consistent with hypoglycemia.   No past medical history on file.  Past Surgical History:  Procedure Laterality Date  . TONSILLECTOMY      Family History  Problem Relation Age of Onset  . Breast cancer Unknown        grandmother  . Diabetes Mother   . Hypertension Father   . Alcohol abuse Brother     Social History   Socioeconomic History  . Marital status: Married    Spouse name: MaryAnn  . Number of children: 3  . Years of education: HS  . Highest education level: Not on file  Occupational History  . Occupation: dock Insurance underwriter    Comment: [employer]   Social Needs  . Financial resource strain: Not on file  . Food insecurity    Worry: Not on file    Inability: Not on file  . Transportation needs    Medical: Not on file    Non-medical: Not on file  Tobacco Use  . Smoking status: Former Smoker    Quit date: 03/16/1992    Years since quitting: 27.2  . Smokeless tobacco: Never Used  Substance and Sexual Activity  . Alcohol use: No  . Drug use: No  . Sexual activity: Yes    Partners: Female  Lifestyle  . Physical activity    Days per week: Not on file    Minutes per session: Not on file  . Stress: Not on file  Relationships  . Social Herbalist on phone: Not on file    Gets together: Not on file    Attends religious service: Not on file    Active member of club or organization: Not on file    Attends meetings of clubs or organizations: Not on file    Relationship status: Not on file  . Intimate partner violence    Fear of current or ex partner: Not on file    Emotionally abused: Not on file    Physically abused: Not on file    Forced sexual activity: Not on file  Other Topics  Concern  . Not on file  Social History Narrative  . Not on file    No outpatient medications prior to visit.   No facility-administered medications prior to visit.     No Known Allergies  ROS Review of Systems    Objective:    Physical Exam  There were no vitals taken for this visit. Wt Readings from Last 3 Encounters:  05/22/18 237 lb (107.5 kg)  11/08/17 242 lb (109.8 kg)  05/11/17 241 lb (109.3 kg)     Health Maintenance Due  Topic Date Due  . Hepatitis C Screening  [birth year]  . HIV Screening  09/14/1976    There are no preventive care reminders to display for this patient.  Lab Results  Component Value Date   TSH 1.71 12/13/2016   Lab Results  Component Value Date   WBC 9.0 12/13/2016   HGB 16.3 12/13/2016   HCT 48.5 12/13/2016   MCV 85.5 12/13/2016   PLT 304 12/13/2016   Lab Results  Component Value Date   NA 140 11/11/2017   K 4.5 11/11/2017   CO2 27 11/11/2017  GLUCOSE 100 (H) 11/11/2017   BUN 20 11/11/2017   CREATININE 1.11 11/11/2017   BILITOT 0.6 11/11/2017   ALKPHOS 71 12/13/2016   AST 15 11/11/2017   ALT 12 11/11/2017   PROT 6.7 11/11/2017   ALBUMIN 4.4 12/13/2016   CALCIUM 9.3 11/11/2017   Lab Results  Component Value Date   CHOL 203 (H) 11/11/2017   Lab Results  Component Value Date   HDL 47 11/11/2017   Lab Results  Component Value Date   LDLCALC 127 (H) 11/11/2017   Lab Results  Component Value Date   TRIG 172 (H) 11/11/2017   Lab Results  Component Value Date   CHOLHDL 4.3 11/11/2017   Lab Results  Component Value Date   HGBA1C 5.6 05/22/2018      Assessment & Plan:   Problem List Items Addressed This Visit      Endocrine   IFG (impaired fasting glucose)    Other Visit Diagnoses    Screening PSA (prostate specific antigen)          No orders of the defined types were placed in this encounter.   Follow-up: No follow-ups on file.    Beatrice Lecher, MD

## 2019-10-30 ENCOUNTER — Ambulatory Visit (INDEPENDENT_AMBULATORY_CARE_PROVIDER_SITE_OTHER): Payer: BC Managed Care – PPO

## 2019-10-30 ENCOUNTER — Other Ambulatory Visit: Payer: Self-pay

## 2019-10-30 ENCOUNTER — Ambulatory Visit (INDEPENDENT_AMBULATORY_CARE_PROVIDER_SITE_OTHER): Payer: BC Managed Care – PPO | Admitting: Family Medicine

## 2019-10-30 ENCOUNTER — Encounter: Payer: Self-pay | Admitting: Family Medicine

## 2019-10-30 VITALS — BP 124/88 | HR 87 | Ht 74.0 in | Wt 256.0 lb

## 2019-10-30 DIAGNOSIS — M545 Low back pain, unspecified: Secondary | ICD-10-CM

## 2019-10-30 DIAGNOSIS — M25551 Pain in right hip: Secondary | ICD-10-CM

## 2019-10-30 DIAGNOSIS — G8929 Other chronic pain: Secondary | ICD-10-CM | POA: Diagnosis not present

## 2019-10-30 DIAGNOSIS — R7301 Impaired fasting glucose: Secondary | ICD-10-CM | POA: Diagnosis not present

## 2019-10-30 DIAGNOSIS — G5793 Unspecified mononeuropathy of bilateral lower limbs: Secondary | ICD-10-CM | POA: Diagnosis not present

## 2019-10-30 DIAGNOSIS — Z125 Encounter for screening for malignant neoplasm of prostate: Secondary | ICD-10-CM

## 2019-10-30 LAB — POCT GLYCOSYLATED HEMOGLOBIN (HGB A1C): Hemoglobin A1C: 5.5 % (ref 4.0–5.6)

## 2019-10-30 NOTE — Assessment & Plan Note (Signed)
We will do some additional labs including CBC TSH and B12 just to rule out any other underlying neuropathy.  Consider that it could be coming from his back.  But also will consider EMG studies in the future to figure out where the neuropathy is coming from.

## 2019-10-30 NOTE — Assessment & Plan Note (Signed)
We will get plain film x-rays today and consider MRI for further work-up that could also be causing some of the neuropathic symptoms that he is getting in his feet bilaterally.

## 2019-10-30 NOTE — Assessment & Plan Note (Signed)
A1c looks great.  Plan to recheck in 1 year.

## 2019-10-30 NOTE — Progress Notes (Signed)
Established Patient Office Visit  Subjective:  Patient ID: Elijah Vargas, male    DOB: 1961-04-13  Age: 59 y.o. MRN: [id number]  CC:  Chief Complaint  Patient presents with  . Hip Pain  . Foot Pain    HPI Elijah Vargas presents for bilat hip and feet pain.    About 2 years ago he started getting some numbness and tingling in his distal feet particularly behind the toes and going over the lateral part of the foot.  He said it came on suddenly and in both feet around the same time.  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  As well as some contracture of the Achilles tendons.  He recently had custom orthotics done in January.  Complains of burning and tingling in his toes.  He has had a trial of gabapentin up to 300 mg.  He is even had a couple of injections and got no relief at all not even for a few days.  So the podiatrist suggested that it could actually be coming from his back.  He has had low back pain for years is always midline.  And over the last few years he is also been experiencing right outer hip pain that radiates down into his anterior thigh.  He says that it bothersome enough that he sometimes most has a limp or change in his gait because of the right hip pain.  And is even hard to change the clutch in his truck just to lift that leg at times.  He did have an x-ray in March 2019 for his right hip which showed some degenerative changes of the lumbar spine and both hips.  He also had an x-ray of the lumbar spine in August 2015 showing some mild lumbar spondylosis with some mild disc space narrowing and osteophyte formation at L2-3 and L3-4.  He also had some mild facet disease.  He did has home physical therapy after his initial imaging in 2015.  History reviewed. No pertinent past medical history.  Past Surgical History:  Procedure Laterality Date  . TONSILLECTOMY      Family History   Problem Relation Age of Onset  . Breast cancer Unknown        grandmother  . Diabetes Mother   . Hypertension Father   . Alcohol abuse Brother     Social History   Socioeconomic History  . Marital status: Married    Spouse name: MaryAnn  . Number of children: 3  . Years of education: HS  . Highest education level: Not on file  Occupational History  . 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 Smoking status: Former Smoker    Quit date: 03/16/1992    Years since quitting: 27.6  . Smokeless tobacco: Never Used  Substance and Sexual Activity  . Alcohol use: No  . Drug use: No  . Sexual activity: Yes    Partners: Female  Other Topics Concern  . Not on file  Social History Narrative  . Not on file   Social Determinants of Health   Financial Resource Strain:   . Difficulty of Paying Living Expenses:   Food Insecurity:   . Worried About Charity fundraiser in the Last Year:   . Arboriculturist in the Last Year:   Transportation Needs:   . Film/video editor (Medical):   Marland Kitchen  Lack of Transportation (Non-Medical):   Physical Activity:   . Days of Exercise per Week:   . Minutes of Exercise per Session:   Stress:   . Feeling of Stress :   Social Connections:   . Frequency of Communication with Friends and Family:   . Frequency of Social Gatherings with Friends and Family:   . Attends Religious Services:   . Active Member of Clubs or Organizations:   . Attends Archivist Meetings:   Marland Kitchen Marital Status:   Intimate Partner Violence:   . Fear of Current or Ex-Partner:   . Emotionally Abused:   Marland Kitchen Physically Abused:   . Sexually Abused:     Outpatient Medications Prior to Visit  Medication Sig Dispense Refill  . gabapentin (NEURONTIN) 300 MG capsule Take 300 mg by mouth 3 (three) times daily.     No facility-administered medications prior to visit.    No Known Allergies  ROS Review of Systems    Objective:    Physical Exam   Constitutional: He is oriented to person, place, and time. He appears well-developed and well-nourished.  HENT:  Head: Normocephalic and atraumatic.  Eyes: Conjunctivae and EOM are normal.  Cardiovascular: Normal rate.  Pulmonary/Chest: Effort normal.  Musculoskeletal:     Comments: Normal lumbar flexion and extension.  Normal rotation right and left.  He had pain in the lumbar spine with side bending to the left and the right.  Hip flexion strength was just slightly weaker 4+/5 on the right compared to the left which was 5 out of 5.  Knee, ankle strength is 5 out of 5.  Slightly weaker with abduction at the right hip compared to the left.  Nontender over the lumbar spine and paraspinous muscles.  Neurological: He is alert and oriented to person, place, and time.  Skin: Skin is dry. No pallor.  Psychiatric: He has a normal mood and affect. His behavior is normal.  Vitals reviewed.   BP 124/88   Pulse 87   Ht 6\' 2"  (1.88 m)   Wt 256 lb (116.1 kg)   SpO2 97%   BMI 32.87 kg/m  Wt Readings from Last 3 Encounters:  10/30/19 256 lb (116.1 kg)  05/22/18 237 lb (107.5 kg)  11/08/17 242 lb (109.8 kg)     Health Maintenance Due  Topic Date Due  . Hepatitis C Screening  Never done  . HIV Screening  Never done    There are no preventive care reminders to display for this patient.  Lab Results  Component Value Date   TSH 1.71 12/13/2016   Lab Results  Component Value Date   WBC 9.0 12/13/2016   HGB 16.3 12/13/2016   HCT 48.5 12/13/2016   MCV 85.5 12/13/2016   PLT 304 12/13/2016   Lab Results  Component Value Date   NA 140 11/11/2017   K 4.5 11/11/2017   CO2 27 11/11/2017   GLUCOSE 100 (H) 11/11/2017   BUN 20 11/11/2017   CREATININE 1.11 11/11/2017   BILITOT 0.6 11/11/2017   ALKPHOS 71 12/13/2016   AST 15 11/11/2017   ALT 12 11/11/2017   PROT 6.7 11/11/2017   ALBUMIN 4.4 12/13/2016   CALCIUM 9.3 11/11/2017   Lab Results  Component Value Date   CHOL 203 (H)  11/11/2017   Lab Results  Component Value Date   HDL 47 11/11/2017   Lab Results  Component Value Date   LDLCALC 127 (H) 11/11/2017   Lab Results  Component Value  Date   TRIG 172 (H) 11/11/2017   Lab Results  Component Value Date   CHOLHDL 4.3 11/11/2017   Lab Results  Component Value Date   HGBA1C 5.5 10/30/2019      Assessment & Plan:   Problem List Items Addressed This Visit      Endocrine   IFG (impaired fasting glucose) - Primary    A1c looks great.  Plan to recheck in 1 year.      Relevant Orders   POCT glycosylated hemoglobin (Hb A1C) (Completed)   COMPLETE METABOLIC PANEL WITH GFR   Lipid Panel w/reflex Direct LDL     Other   Right hip pain    We will get additional imaging with x-rays today and will likely need to move forward with MRI since has been going on for a couple of years now and seems to be getting progressively worse.      Relevant Orders   DG Hip Unilat W OR W/O Pelvis 2-3 Views Right   Neuropathic pain of both feet    We will do some additional labs including CBC TSH and B12 just to rule out any other underlying neuropathy.  Consider that it could be coming from his back.  But also will consider EMG studies in the future to figure out where the neuropathy is coming from.      Relevant Orders   COMPLETE METABOLIC PANEL WITH GFR   Lipid Panel w/reflex Direct LDL   B12   CBC   Chronic midline low back pain without sciatica    We will get plain film x-rays today and consider MRI for further work-up that could also be causing some of the neuropathic symptoms that he is getting in his feet bilaterally.      Relevant Orders   DG Lumbar Spine Complete    Other Visit Diagnoses    Screening for prostate cancer       Relevant Orders   PSA      No orders of the defined types were placed in this encounter.   Follow-up: Return in about 2 weeks (around 11/13/2019) for back and feet.    Beatrice Lecher, MD

## 2019-10-30 NOTE — Assessment & Plan Note (Signed)
We will get additional imaging with x-rays today and will likely need to move forward with MRI since has been going on for a couple of years now and seems to be getting progressively worse.

## 2019-10-31 LAB — CBC
HCT: 46.6 % (ref 38.5–50.0)
Hemoglobin: 16 g/dL (ref 13.2–17.1)
MCH: 29.4 pg (ref 27.0–33.0)
MCHC: 34.3 g/dL (ref 32.0–36.0)
MCV: 85.5 fL (ref 80.0–100.0)
MPV: 10.2 fL (ref 7.5–12.5)
Platelets: 280 10*3/uL (ref 140–400)
RBC: 5.45 10*6/uL (ref 4.20–5.80)
RDW: 13.1 % (ref 11.0–15.0)
WBC: 7.8 10*3/uL (ref 3.8–10.8)

## 2019-10-31 LAB — COMPLETE METABOLIC PANEL WITH GFR
AG Ratio: 1.7 (calc) (ref 1.0–2.5)
ALT: 15 U/L (ref 9–46)
AST: 18 U/L (ref 10–35)
Albumin: 4.4 g/dL (ref 3.6–5.1)
Alkaline phosphatase (APISO): 55 U/L (ref 35–144)
BUN: 17 mg/dL (ref 7–25)
CO2: 23 mmol/L (ref 20–32)
Calcium: 9.5 mg/dL (ref 8.6–10.3)
Chloride: 106 mmol/L (ref 98–110)
Creat: 1 mg/dL (ref 0.70–1.33)
GFR, Est African American: 96 mL/min/{1.73_m2} (ref >=60)
GFR, Est Non African American: 83 mL/min/{1.73_m2} (ref >=60)
Globulin: 2.6 g/dL (calc) (ref 1.9–3.7)
Glucose, Bld: 110 mg/dL — ABNORMAL HIGH (ref 65–99)
Potassium: 4.6 mmol/L (ref 3.5–5.3)
Sodium: 138 mmol/L (ref 135–146)
Total Bilirubin: 0.4 mg/dL (ref 0.2–1.2)
Total Protein: 7 g/dL (ref 6.1–8.1)

## 2019-10-31 LAB — LIPID PANEL W/REFLEX DIRECT LDL
Cholesterol: 217 mg/dL — ABNORMAL HIGH (ref <200)
HDL: 49 mg/dL (ref >=40)
LDL Cholesterol (Calc): 141 mg/dL (calc) — ABNORMAL HIGH
Non-HDL Cholesterol (Calc): 168 mg/dL (calc) — ABNORMAL HIGH (ref <130)
Total CHOL/HDL Ratio: 4.4 (calc) (ref <5.0)
Triglycerides: 148 mg/dL (ref <150)

## 2019-10-31 LAB — PSA: PSA: 0.4 ng/mL (ref <=4.0)

## 2019-10-31 LAB — VITAMIN B12: Vitamin B-12: 308 pg/mL (ref 200–1100)

## 2019-10-31 NOTE — Addendum Note (Signed)
Addended by: Beatrice Lecher D on: 10/31/2019 04:18 PM   Modules accepted: Orders

## 2019-11-12 ENCOUNTER — Ambulatory Visit: Payer: BC Managed Care – PPO | Admitting: Family Medicine

## 2019-11-23 ENCOUNTER — Telehealth: Payer: Self-pay

## 2019-11-23 DIAGNOSIS — M545 Low back pain, unspecified: Secondary | ICD-10-CM

## 2019-11-23 DIAGNOSIS — M169 Osteoarthritis of hip, unspecified: Secondary | ICD-10-CM

## 2019-11-23 DIAGNOSIS — G8929 Other chronic pain: Secondary | ICD-10-CM

## 2019-11-23 DIAGNOSIS — M25551 Pain in right hip: Secondary | ICD-10-CM

## 2019-11-23 NOTE — Telephone Encounter (Signed)
Routing to covering provider.  °

## 2019-11-23 NOTE — Telephone Encounter (Signed)
Everything is worn out, but he probably needs to follow-up with [facility] for a plan, I do not see that she had wanted him to see me yet.

## 2019-11-23 NOTE — Telephone Encounter (Signed)
Ladarion's wife called for MRI results.    MR HIP RIGHT WO CONTRAST4/12/2019 [facility] Result Impression   1. 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  2. Degenerative tear of the superior right acetabular labrum. 3. Mild right iliopsoas bursitis. 4. Edema-like marrow signal in the right ilium at the iliofemoral attachment may reflect either or a combination of degenerative marrow signal or reactive marrow edema due to iliofemoral ligament sprain.  Result Narrative  MR HIP RIGHT WO CONTRAST, 11/17/2019 8:53 PM  INDICATION: \ M25.551 Right hip pain  COMPARISON: None.  TECHNIQUE: Multi-planar, multi-sequence MR imaging of the right hip was performed without contrast.  FINDINGS:  PELVIS: Anterior pelvic ring: Within normal limits. Posterior pelvic ring: Within normal limits. Left hip/proximal femur: Examination is not tailored to evaluate the internal structures of the left hip. No gross abnormality. Soft tissues (extra-pelvic): Mild right iliopsoas bursitis. Mild right hamstrings tendinosis. Viscera: Colonic diverticulosis.  RIGHT HIP/PROXIMAL FEMUR: Femur: Within normal limits Hip joint: Severe degenerative changes with high-grade chondrosis involving the superior femoral head and acetabular dome. Edema like marrow signal in the ilium at the iliofemoral attachment. Small reactive right hip joint effusion and synovitis. Labrum: Degenerative tear of the superior labrum. Soft tissues (peri-articular): No abnormal masses or fluid collections.  Additional comments: Partially imaged degenerative changes in the lower lumbar spine.  Other Result Information  Interface, Rad Results In - 11/19/2019  1:49 PM EDT Formatting of this note might be different from the original. MR HIP RIGHT WO CONTRAST, 11/17/2019 8:53 PM  INDICATION: \ M25.551 Right hip pain  COMPARISON: None.  TECHNIQUE:  Multi-planar, multi-sequence MR imaging of the right hip was performed without contrast.  FINDINGS:  PELVIS: Anterior pelvic ring: Within normal limits. Posterior pelvic ring: Within normal limits. Left hip/proximal femur: Examination is not tailored to evaluate the internal structures of the left hip. No gross abnormality. Soft tissues (extra-pelvic): Mild right iliopsoas bursitis. Mild right hamstrings tendinosis. Viscera: Colonic diverticulosis.  RIGHT HIP/PROXIMAL FEMUR: Femur: Within normal limits Hip joint: Severe degenerative changes with high-grade chondrosis involving the superior femoral head and acetabular dome. Edema like marrow signal in the ilium at the iliofemoral attachment. Small reactive right hip joint effusion and synovitis. Labrum: Degenerative tear of the superior labrum. Soft tissues (peri-articular): No abnormal masses or fluid collections.  Additional comments: Partially imaged degenerative changes in the lower lumbar spine.  CONCLUSION:  1.  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  2.  Degenerative tear of the superior right acetabular labrum. 3.  Mild right iliopsoas bursitis. 4.  Edema-like marrow signal in the right ilium at the iliofemoral attachment may reflect either or a combination of degenerative marrow signal or reactive marrow edema due to iliofemoral ligament sprain.  Status    MR SPINE LUMBAR WO CONTRAST4/12/2019 [facility] Result Impression  :  1. Transitional thoracolumbar anatomy.  2. Moderately large age indeterminate T12 superior endplate Schmorl's node is incompletely visualized. Recommend correlation with clinical symptoms. Comparison with priors, if available would be useful for further characterization.  3. Multilevel moderate lumbar spondylosis contributing to multilevel mild to moderate neural foraminal stenoses and effacement of  subarticular recesses without significant spinal canal compromise detailed above.  Result Narrative  MRI LUMBAR SPINE WITHOUT CONTRAST, 11/17/2019 9:16 PM  INDICATION: \ G57.93 Neuropathic pain of both feet \ M54.5 Chronic midline low back pain  without sciatica \ G89.29 Chronic midline low back pain without sciatica   COMPARISON: None.  TECHNIQUE: Multiplanar, multisequence surface-coil magnetic resonance imaging of the lumbar spine was performed without contrast.  LEVELS IMAGED: Lower thoracic to the upper sacral region.  FINDINGS:   Transitional thoracolumbar anatomy with rudimentary rib associated with the left L1 transverse process. The disc space identified on axial image #40 will be referred to as the L5-S1 disc space for purposes of this examination. Mild-to-moderate disc desiccation and loss of disc space height at T12-L4. Mild disc desiccation loss of disc space height at L4-L5. Disc hydration and disc height relatively well maintained at L5-S1. Small bilateral facet joint effusions at L1-L2, L2-L3, L3-L4 and L4-L5.  T11-T12: This is incompletely visualized on sagittal sequences only. There is a large T12 superior endplate Schmorl's node present. Moderate associated edema is noted.  T12-L1: Broad-based disc bulge contributes to mild effacement of subarticular recesses.  L1-L2: Mild bilateral facet joint ligament flavum hypertrophy with broad-based disc bulge contributes to mild bilateral neural foraminal stenosis and effacement of subarticular recesses.  L2-L3: Mild bilateral facet joint and ligamentum flavum hypertrophy with broad-based disc bulge contributes to mild bilateral neural foraminal stenosis with moderate effacement of subarticular recesses.  L3-L4: Mild retrolisthesis of L3 on L4 with uncovering of the disc/broad-based disc bulge and associated marginal osteophytic ridging with mild bilateral facet joint ligament flavum hypertrophy contribute to mild bilateral neural  foraminal stenosis with moderate effacement of the left greater than right subarticular recesses.  L4-L5: Moderate bilateral facet joint hypertrophy with mild bilateral ligamentum flavum hypertrophy and broad-based disc bulge contribute to moderate right greater than left neural foraminal stenosis with moderate effacement of subarticular recesses.  L5-S1: Moderate right and mild left facet joint hypertrophy. Spinal canal and neural foramina widely patent.  The conus terminates at L1. Vertebral body alignment and vertebral body height are otherwise maintained throughout the lumbar spine with no other spondylolisthesis or abnormality of marrow or spinal cord signal identified. Infrarenal abdominal aortic ectasia does not meet imaging criteria for aneurysm.  Other Result Information  Interface, Rad Results In - 11/19/2019  9:10 AM EDT Formatting of this note might be different from the original. MRI LUMBAR  SPINE WITHOUT CONTRAST, 11/17/2019 9:16 PM  INDICATION:  \ G57.93 Neuropathic pain of both feet \ M54.5 Chronic midline low back pain without sciatica \ G89.29 Chronic midline low back pain without sciatica   COMPARISON:  None.  TECHNIQUE: Multiplanar, multisequence surface-coil magnetic resonance imaging of the lumbar spine was performed without contrast.  LEVELS IMAGED: Lower thoracic to the upper sacral region.  FINDINGS:    Transitional thoracolumbar anatomy with rudimentary rib associated with the left L1 transverse process. The disc space identified on axial image #40 will be referred to as the L5-S1 disc space for purposes of this examination. Mild-to-moderate disc desiccation and loss of disc space height at T12-L4. Mild disc desiccation loss of disc space height at L4-L5. Disc hydration and disc height relatively well maintained at L5-S1. Small bilateral facet joint effusions at L1-L2, L2-L3, L3-L4 and L4-L5.  T11-T12: This is incompletely visualized on sagittal sequences only. There  is a large T12 superior endplate Schmorl's node present. Moderate associated edema is noted.  T12-L1: Broad-based disc bulge contributes to mild effacement of subarticular recesses.  L1-L2: Mild bilateral facet joint ligament flavum hypertrophy with broad-based disc bulge contributes to mild bilateral neural foraminal stenosis and effacement of subarticular recesses.  L2-L3: Mild bilateral facet joint and ligamentum flavum hypertrophy  with broad-based disc bulge contributes to mild bilateral neural foraminal stenosis with moderate effacement of subarticular recesses.  L3-L4: Mild retrolisthesis of L3 on L4 with uncovering of the disc/broad-based disc bulge and associated marginal osteophytic ridging with mild bilateral facet joint ligament flavum hypertrophy contribute to mild bilateral neural foraminal stenosis with moderate effacement of the left greater than right subarticular recesses.  L4-L5: Moderate bilateral facet joint hypertrophy with mild bilateral ligamentum flavum hypertrophy and broad-based disc bulge contribute to moderate right greater than left neural foraminal stenosis with moderate effacement of subarticular recesses.  L5-S1: Moderate right and mild left facet joint hypertrophy. Spinal canal and neural foramina widely patent.  The conus terminates at L1. Vertebral body alignment and vertebral body height are otherwise maintained throughout the lumbar spine with no other spondylolisthesis or abnormality of marrow or spinal cord signal identified. Infrarenal abdominal aortic ectasia does not meet imaging criteria for aneurysm.  CONCLUSION: :  1. Transitional thoracolumbar anatomy.  2. Moderately large age indeterminate T12 superior endplate Schmorl's node is incompletely visualized. Recommend correlation with clinical symptoms. Comparison with priors, if available would be useful for further characterization.  3. Multilevel moderate lumbar spondylosis contributing to  multilevel mild to moderate neural foraminal stenoses and effacement of subarticular recesses without significant spinal canal compromise detailed above.  Status

## 2019-11-23 NOTE — Telephone Encounter (Signed)
Left a message stating we will wait for Dr Madilyn Fireman to reply.

## 2019-11-26 NOTE — Telephone Encounter (Signed)
Call patient and let him know that I am sorry I was out of town.  So just now reviewing his MRI results.  MRI shows severe right hip degenerative changes as well as some acute reactive inflammation and swelling.  He also has a tear of the labrum in the right hip.  He is probably at the point where he needs an orthopedic evaluation for possible hip replacement.  See what he would prefer to do or if he has a preference for who he would like to see.

## 2019-11-28 NOTE — Telephone Encounter (Signed)
orth refer placed

## 2019-11-28 NOTE — Telephone Encounter (Signed)
Elijah Vargas would like to proceed with ortho referral. He is really concerned about the MRI of the lumbar spine. His wife states this is the area he is most concerned with.    [phone]

## 2019-11-28 NOTE — Telephone Encounter (Signed)
Left a message for a return call.

## 2019-11-29 NOTE — Telephone Encounter (Signed)
Wife advised. 

## 2019-12-11 ENCOUNTER — Ambulatory Visit: Payer: BC Managed Care – PPO | Admitting: Orthopaedic Surgery

## 2019-12-11 ENCOUNTER — Other Ambulatory Visit: Payer: Self-pay

## 2019-12-11 VITALS — Ht 74.0 in | Wt 256.0 lb

## 2019-12-11 DIAGNOSIS — M1611 Unilateral primary osteoarthritis, right hip: Secondary | ICD-10-CM | POA: Diagnosis not present

## 2019-12-11 DIAGNOSIS — M25551 Pain in right hip: Secondary | ICD-10-CM

## 2019-12-11 NOTE — Progress Notes (Signed)
Office Visit Note   Patient: Elijah Vargas           Date of Birth: 06/15/1961           MRN: [id number] Visit Date: 12/11/2019              Requested by: Hali Marry, [address] [place],  [place] [address] PCP: Hali Marry, MD   Assessment & Plan: Visit Diagnoses:  1. Unilateral primary osteoarthritis, right hip   2. Right hip pain     Plan: Given the severity of the patient's arthritis and his clinical exam findings we are recommending hip replacement surgery.  I talked in detail about anterior hip replacement surgery.  I discussed what the interoperative and postoperative course involves.  I described the risk and benefits of surgery as well.  I did give him a handout about this.  All question concerns were answered and addressed.  He said he will let us know if he is getting to the point where he would like to proceed with the surgery.  Follow-Up Instructions: Return if symptoms worsen or fail to improve.   Orders:  No orders of the defined types were placed in this encounter.  No orders of the defined types were placed in this encounter.     Procedures: No procedures performed   Clinical Data: No additional findings.   Subjective: Chief Complaint  Patient presents with  . Right Hip - Pain  . Lower Back - Pain  The patient sent to me from Dr. Madilyn Fireman to evaluate and treat severe osteoarthritis of the right hip.  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  He had some back issues as well but is been mainly his hip.  He is not really seen his x-rays or his MRI report.  This is been slowly getting worse for well over a year now.  It hurts with mainly climbing up into his truck as well as pivoting activities and slide over.  He does not have any left hip pain and denies any issues with his knees.  He is never injured this hip that he is aware of.  He does not have a family history of joint  replacements.  He is not a diabetic.  He denies smoking.  At this point his hip pain is detrimentally affecting his actives of daily living, his quality of life and his mobility.  He has a harder time putting his shoes and socks on the right side compared to the left side.  He has tried activity modification.  He does take anti-inflammatories and is worked on stretching and strengthening exercises.  HPI  Review of Systems He currently denies any headache, chest pain, shortness of breath, fever, chills, nausea, vomiting  Objective: Vital Signs: Ht 6\' 2"  (1.88 m)   Wt 256 lb (116.1 kg)   BMI 32.87 kg/m   Physical Exam He is alert and orient x3 and in no acute distress Ortho Exam Examination of his right hip shows significant pain with internal and external rotation with significant stiffness as well and decreased rotation.  His left hip exam is normal.  His bilateral knee exam is normal. Specialty Comments:  No specialty comments available.  Imaging: No results found. X-rays on the canopy system reviewed as well as a MRI report showing the right hip.  He does have significant arthritis of his right hip.  There are large peritracheal  osteophytes.  There is cystic change in the femoral head.  There is slight joint space narrowing.  The MRI shows that the arthritis is quite more severe.  There is edema changes in the roof of the acetabulum and the femoral head.  There is degenerative labral tear as well as full-thickness cartilage loss.  PMFS History: Patient Active Problem List   Diagnosis Date Noted  . Unilateral primary osteoarthritis, right hip 12/11/2019  . Right hip pain 10/30/2019  . Chronic midline low back pain without sciatica 10/30/2019  . Neuropathic pain of both feet 10/30/2019  . Kidney stones 05/22/2018  . IFG (impaired fasting glucose) 05/11/2017  . Hypogonadism in male 01/07/2017   No past medical history on file.  Family History  Problem Relation Age of Onset  .  Breast cancer Unknown        grandmother  . Diabetes Mother   . Hypertension Father   . Alcohol abuse Brother     Past Surgical History:  Procedure Laterality Date  . TONSILLECTOMY     Social History   Occupational History  . 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 Smoking status: Former Smoker    Quit date: 03/16/1992    Years since quitting: 27.7  . Smokeless tobacco: Never Used  Substance and Sexual Activity  . Alcohol use: No  . Drug use: No  . Sexual activity: Yes    Partners: Female

## 2020-12-29 ENCOUNTER — Telehealth: Payer: Self-pay

## 2020-12-29 NOTE — Telephone Encounter (Signed)
Patients wife called she stated the patient is ready to scheduled his hip replacement surgery with Dr.Blackman call back:[phone]

## 2021-01-01 ENCOUNTER — Telehealth: Payer: Self-pay

## 2021-01-01 DIAGNOSIS — N4889 Other specified disorders of penis: Secondary | ICD-10-CM

## 2021-01-01 NOTE — Telephone Encounter (Signed)
I called and left voice mail for return call. 

## 2021-01-06 NOTE — Telephone Encounter (Signed)
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  Also, patient should wait to fly at least 4 weeks after surgery.  I made ROV for patient since it has been over a year since last appointment.  We can discuss scheduling at that point.

## 2021-01-19 NOTE — Telephone Encounter (Signed)
Patient's wife actually came in for her visit today and mentioned/requested referral to urology.  He unfortunately recently in the last couple of months has started to notice significant curve to the penis and wanted to know if should see urology.  He saw urology several years ago for kidney stones.  He was seen at [facility] urology in [place] and would like to be referred back there.  Referral placed.

## 2021-01-20 ENCOUNTER — Encounter: Payer: Self-pay | Admitting: Orthopaedic Surgery

## 2021-01-20 ENCOUNTER — Ambulatory Visit: Payer: Self-pay

## 2021-01-20 ENCOUNTER — Ambulatory Visit: Payer: BC Managed Care – PPO | Admitting: Orthopaedic Surgery

## 2021-01-20 VITALS — Ht 75.0 in | Wt 254.8 lb

## 2021-01-20 DIAGNOSIS — M1611 Unilateral primary osteoarthritis, right hip: Secondary | ICD-10-CM | POA: Diagnosis not present

## 2021-01-20 DIAGNOSIS — M25551 Pain in right hip: Secondary | ICD-10-CM

## 2021-01-20 NOTE — Progress Notes (Signed)
Office Visit Note   Patient: Elijah Vargas           Date of Birth: [birth year]           MRN: [id number] Visit Date: 01/20/2021              Requested by: Hali Marry, [address],  [place] [address] PCP: Hali Marry, MD   Assessment & Plan: Visit Diagnoses:  1. Unilateral primary osteoarthritis, right hip   2. Right hip pain     Plan: Given the well-documented severe osteoarthritis of his right hip combined with his clinical exam findings and x-ray findings, we are once again recommending a right total hip arthroplasty.  At this point given his daily pain and detrimental effect is having on his mobility, his quality of life and his actives daily living he does wish to proceed with the surgery.  We had a long thorough discussion again about the risks and benefits of surgery.  We talked about the interoperative and postoperative course and I showed him the implants as well.  All questions and concerns were answered and addressed.  We will work on getting this scheduled.  Follow-Up Instructions: Return for 2 weeks post-op.   Orders:  Orders Placed This Encounter  Procedures  . XR HIP UNILAT W OR W/O PELVIS 2-3 VIEWS RIGHT   No orders of the defined types were placed in this encounter.     Procedures: No procedures performed   Clinical Data: No additional findings.   Subjective: Chief Complaint  Patient presents with  . Right Hip - Pain  The patient has known well-documented osteoarthritis of the right hip.  It has been over a year since I saw him last and at that visit we had recommended a hip replacement for his right hip once the pain was detriment affecting his mobility, his quality of life and his actives daily living.  He has x-rays in the past of documented showing osteoarthritis of the right hip.  He says he is at the point he does wish proceed with hip replacement surgery on his right hip.  He is having more  difficulty crossing his leg and putting shoes and socks on and is having daily pain in the right groin with activities.  He has had no other acute change in medical status.  He is not a diabetic.  He is a healthy 60 year old gentleman.  Even a year ago when we saw him we recommended hip replacement surgery.  HPI  Review of Systems There is currently listed no headache, chest pain, shortness of breath, fever, chills, nausea, vomiting  Objective: Vital Signs: Ht 6\' 3"  (1.905 m)   Wt 254 lb 12.8 oz (115.6 kg)   BMI 31.85 kg/m   Physical Exam He is alert and oriented x3 and in no acute distress Ortho Exam Examination of his left hip is normal.  Examination of his painful right hip shows significant pain on extremes of internal and external rotation as well as stiffness with rotation of the right hip. Specialty Comments:  No specialty comments available.  Imaging: XR HIP UNILAT W OR W/O PELVIS 2-3 VIEWS RIGHT  Result Date: 01/20/2021 X-rays of the pelvis and right hip show moderate to severe right hip osteoarthritis with joint space narrowing and particular osteophytes around the femoral head.    PMFS History: Patient Active Problem List   Diagnosis Date Noted  . Unilateral primary osteoarthritis, right hip 12/11/2019  .  Right hip pain 10/30/2019  . Chronic midline low back pain without sciatica 10/30/2019  . Neuropathic pain of both feet 10/30/2019  . Kidney stones 05/22/2018  . IFG (impaired fasting glucose) 05/11/2017  . Hypogonadism in male 01/07/2017   History reviewed. No pertinent past medical history.  Family History  Problem Relation Age of Onset  . Breast cancer Other        grandmother  . Diabetes Mother   . Hypertension Father   . Alcohol abuse Brother     Past Surgical History:  Procedure Laterality Date  . TONSILLECTOMY     Social History   Occupational History  . 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 Smoking status: Former Smoker     Quit date: 03/16/1992    Years since quitting: 28.8  . Smokeless tobacco: Never Used  Substance and Sexual Activity  . Alcohol use: No  . Drug use: No  . Sexual activity: Yes    Partners: Female

## 2021-02-03 ENCOUNTER — Other Ambulatory Visit: Payer: Self-pay

## 2021-02-11 ENCOUNTER — Other Ambulatory Visit: Payer: Self-pay | Admitting: Physician Assistant

## 2021-02-19 NOTE — Pre-Procedure Instructions (Signed)
Surgical Instructions    Your procedure is scheduled on Tuesday, July 12th.  Report to [facility] Main Entrance "A" at 12:30 P.M., then check in with the Admitting office.  Call this number if you have problems the morning of surgery:  [phone]   If you have any questions prior to your surgery date call [phone]: Open Monday-Friday 8am-4pm    Remember:  Do not eat after midnight the night before your surgery  You may drink clear liquids until 11:30 a.m. the morning of your surgery.   Clear liquids allowed are: Water, Non-Citrus Juices (without pulp), Carbonated Beverages, Clear Tea, Black Coffee Only, and Gatorade.   Enhanced Recovery after Surgery for Orthopedics Enhanced Recovery after Surgery is a protocol used to improve the stress on your body and your recovery after surgery.  Patient Instructions  The day of surgery (if you do NOT have diabetes):  Drink ONE (1) Pre-Surgery Clear Ensure by 11:30 am the morning of surgery   This drink was given to you during your hospital  pre-op appointment visit. Nothing else to drink after completing the  Pre-Surgery Clear Ensure.         If you have questions, please contact your surgeon's office.     Take these medicines the morning of surgery with A SIP OF WATER  acetaminophen (TYLENOL)   As of today, STOP taking any Aspirin (unless otherwise instructed by your surgeon) Aleve, Naproxen, Ibuprofen, Motrin, Advil, Goody's, BC's, all herbal medications, fish oil, and all vitamins.                     Do NOT Smoke (Tobacco/Vaping) or drink Alcohol 24 hours prior to your procedure.  If you use a CPAP at night, you may bring all equipment for your overnight stay.   Contacts, glasses, piercing's, hearing aid's, dentures or partials may not be worn into surgery, please bring cases for these belongings.    For patients admitted to the hospital, discharge time will be determined by your treatment team.   Patients discharged the  day of surgery will not be allowed to drive home, and someone needs to stay with them for 24 hours.  ONLY 1 SUPPORT PERSON MAY BE PRESENT WHILE YOU ARE IN SURGERY. IF YOU ARE TO BE ADMITTED ONCE YOU ARE IN YOUR ROOM YOU WILL BE ALLOWED TWO (2) VISITORS.  Minor children may have two parents present. Special consideration for safety and communication needs will be reviewed on a case by case basis.   Special instructions:   [place]- Preparing For Surgery  Before surgery, you can play an important role. Because skin is not sterile, your skin needs to be as free of germs as possible. You can reduce the number of germs on your skin by washing with CHG (chlorahexidine gluconate) Soap before surgery.  CHG is an antiseptic cleaner which kills germs and bonds with the skin to continue killing germs even after washing.    Oral Hygiene is also important to reduce your risk of infection.  Remember - BRUSH YOUR TEETH THE MORNING OF SURGERY WITH YOUR REGULAR TOOTHPASTE  Please do not use if you have an allergy to CHG or antibacterial soaps. If your skin becomes reddened/irritated stop using the CHG.  Do not shave (including legs and underarms) for at least 48 hours prior to first CHG shower. It is OK to shave your face.  Please follow these instructions carefully.   Shower the NIGHT BEFORE SURGERY and the [facility]  OF SURGERY  If you chose to wash your hair, wash your hair first as usual with your normal shampoo.  After you shampoo, rinse your hair and body thoroughly to remove the shampoo.  Use CHG Soap as you would any other liquid soap. You can apply CHG directly to the skin and wash gently with a scrungie or a clean washcloth.   Apply the CHG Soap to your body ONLY FROM THE NECK DOWN.  Do not use on open wounds or open sores. Avoid contact with your eyes, ears, mouth and genitals (private parts). Wash Face and genitals (private parts)  with your normal soap.   Wash thoroughly, paying special  attention to the area where your surgery will be performed.  Thoroughly rinse your body with warm water from the neck down.  DO NOT shower/wash with your normal soap after using and rinsing off the CHG Soap.  Pat yourself dry with a CLEAN TOWEL.  Wear CLEAN PAJAMAS to bed the night before surgery  Place CLEAN SHEETS on your bed the night before your surgery  DO NOT SLEEP WITH PETS.   Day of Surgery: Shower with CHG soap. Do not wear jewelry. Do not wear lotions, powders, colognes, or deodorant. Men may shave face and neck. Do not bring valuables to the hospital. [facility] is not responsible for any belongings or valuables. Wear Clean/Comfortable clothing the morning of surgery Remember to brush your teeth WITH YOUR REGULAR TOOTHPASTE.   Please read over the following fact sheets that you were given.

## 2021-02-20 ENCOUNTER — Other Ambulatory Visit: Payer: Self-pay

## 2021-02-20 ENCOUNTER — Encounter (HOSPITAL_COMMUNITY)
Admission: RE | Admit: 2021-02-20 | Discharge: 2021-02-20 | Disposition: A | Discharge status: Home / Self Care | Payer: BC Managed Care – PPO | Source: Ambulatory Visit | Attending: Orthopaedic Surgery | Admitting: Orthopaedic Surgery

## 2021-02-20 ENCOUNTER — Encounter (HOSPITAL_COMMUNITY): Payer: Self-pay

## 2021-02-20 DIAGNOSIS — Z01812 Encounter for preprocedural laboratory examination: Secondary | ICD-10-CM | POA: Diagnosis present

## 2021-02-20 DIAGNOSIS — Z20822 Contact with and (suspected) exposure to covid-19: Secondary | ICD-10-CM | POA: Diagnosis not present

## 2021-02-20 HISTORY — DX: Personal history of urinary calculi: Z87.442

## 2021-02-20 HISTORY — DX: Unspecified osteoarthritis, unspecified site: M19.90

## 2021-02-20 LAB — CBC
HCT: 48 % (ref 39.0–52.0)
Hemoglobin: 16.3 g/dL (ref 13.0–17.0)
MCH: 30 pg (ref 26.0–34.0)
MCHC: 34 g/dL (ref 30.0–36.0)
MCV: 88.4 fL (ref 80.0–100.0)
Platelets: 273 10*3/uL (ref 150–400)
RBC: 5.43 MIL/uL (ref 4.22–5.81)
RDW: 13.4 % (ref 11.5–15.5)
WBC: 7.6 10*3/uL (ref 4.0–10.5)
nRBC: 0 % (ref 0.0–0.2)

## 2021-02-20 LAB — TYPE AND SCREEN
ABO/RH(D): O NEG
Antibody Screen: NEGATIVE

## 2021-02-20 LAB — SARS CORONAVIRUS 2 (TAT 6-24 HRS): SARS Coronavirus 2: NEGATIVE

## 2021-02-20 LAB — SURGICAL PCR SCREEN
MRSA, PCR: NEGATIVE
Staphylococcus aureus: POSITIVE — AB

## 2021-02-20 NOTE — Progress Notes (Signed)
PCP - Dr. Beatrice Lecher Cardiologist - denies  PPM/ICD - N/A  Chest x-ray - N/A EKG - N/A Stress Test - N/A  ECHO - N/A Cardiac Cath - N/A   Sleep Study - N/A CPAP - N/A  Blood Thinner Instructions: N/A Aspirin Instructions: N/A  ERAS Protcol -Clear liquids until 1130 DOS PRE-SURGERY Ensure or G2- (1) Pre-surgical ensure provided with instructions.   COVID TEST- 02/20/21 done in PAT  Anesthesia review: No  Patient denies shortness of breath, fever, cough and chest pain at PAT appointment   All instructions explained to the patient, with a verbal understanding of the material. Patient agrees to go over the instructions while at home for a better understanding. Patient also instructed to self quarantine after being tested for COVID-19. The opportunity to ask questions was provided.

## 2021-02-24 ENCOUNTER — Encounter (HOSPITAL_COMMUNITY): Payer: Self-pay | Admitting: Orthopaedic Surgery

## 2021-02-24 ENCOUNTER — Ambulatory Visit (HOSPITAL_COMMUNITY): Payer: BC Managed Care – PPO | Admitting: Vascular Surgery

## 2021-02-24 ENCOUNTER — Observation Stay (HOSPITAL_COMMUNITY)
Admission: RE | Admit: 2021-02-24 | Discharge: 2021-02-25 | Disposition: A | Discharge status: Home / Self Care | Payer: BC Managed Care – PPO | Attending: Orthopaedic Surgery | Admitting: Orthopaedic Surgery

## 2021-02-24 ENCOUNTER — Observation Stay (HOSPITAL_COMMUNITY): Payer: BC Managed Care – PPO

## 2021-02-24 ENCOUNTER — Encounter (HOSPITAL_COMMUNITY)
Admission: RE | Disposition: A | Discharge status: Home / Self Care | Payer: Self-pay | Source: Home / Self Care | Attending: Orthopaedic Surgery

## 2021-02-24 ENCOUNTER — Other Ambulatory Visit: Payer: Self-pay

## 2021-02-24 ENCOUNTER — Ambulatory Visit (HOSPITAL_COMMUNITY): Payer: BC Managed Care – PPO

## 2021-02-24 ENCOUNTER — Ambulatory Visit (HOSPITAL_COMMUNITY): Payer: BC Managed Care – PPO | Admitting: Anesthesiology

## 2021-02-24 DIAGNOSIS — Z96641 Presence of right artificial hip joint: Secondary | ICD-10-CM

## 2021-02-24 DIAGNOSIS — Z87891 Personal history of nicotine dependence: Secondary | ICD-10-CM | POA: Insufficient documentation

## 2021-02-24 DIAGNOSIS — M1611 Unilateral primary osteoarthritis, right hip: Secondary | ICD-10-CM | POA: Diagnosis not present

## 2021-02-24 DIAGNOSIS — Z419 Encounter for procedure for purposes other than remedying health state, unspecified: Secondary | ICD-10-CM

## 2021-02-24 HISTORY — PX: TOTAL HIP ARTHROPLASTY: SHX124

## 2021-02-24 LAB — ABO/RH: ABO/RH(D): O NEG

## 2021-02-24 SURGERY — ARTHROPLASTY, HIP, TOTAL, ANTERIOR APPROACH
Anesthesia: Spinal | Site: Hip | Laterality: Right

## 2021-02-24 MED ORDER — HYDROMORPHONE HCL 1 MG/ML IJ SOLN
INTRAMUSCULAR | Status: AC
  Filled 2021-02-24: qty 1

## 2021-02-24 MED ORDER — PANTOPRAZOLE SODIUM 40 MG PO TBEC
40.0000 mg | DELAYED_RELEASE_TABLET | Freq: Every day | ORAL | Status: DC
  Administered 2021-02-24 – 2021-02-25 (×2): 40 mg via ORAL
  Filled 2021-02-24 (×2): qty 1

## 2021-02-24 MED ORDER — BUPIVACAINE IN DEXTROSE 0.75-8.25 % IT SOLN
INTRATHECAL | Status: DC | PRN
  Administered 2021-02-24: 2 mL via INTRATHECAL

## 2021-02-24 MED ORDER — DOCUSATE SODIUM 100 MG PO CAPS
100.0000 mg | ORAL_CAPSULE | Freq: Two times a day (BID) | ORAL | Status: DC
  Administered 2021-02-24 – 2021-02-25 (×2): 100 mg via ORAL
  Filled 2021-02-24 (×2): qty 1

## 2021-02-24 MED ORDER — 0.9 % SODIUM CHLORIDE (POUR BTL) OPTIME
TOPICAL | Status: DC | PRN
  Administered 2021-02-24: 1000 mL

## 2021-02-24 MED ORDER — SODIUM CHLORIDE 0.9 % IV SOLN: INTRAVENOUS | Status: DC

## 2021-02-24 MED ORDER — PHENOL 1.4 % MT LIQD: 1.0000 | OROMUCOSAL | Status: DC | PRN

## 2021-02-24 MED ORDER — CEFAZOLIN SODIUM-DEXTROSE 2-4 GM/100ML-% IV SOLN
2.0000 g | INTRAVENOUS | Status: AC
  Administered 2021-02-24: 2 g via INTRAVENOUS
  Filled 2021-02-24: qty 100

## 2021-02-24 MED ORDER — HYDROMORPHONE HCL 1 MG/ML IJ SOLN: 0.5000 mg | INTRAMUSCULAR | Status: DC | PRN

## 2021-02-24 MED ORDER — HYDROMORPHONE HCL 1 MG/ML IJ SOLN
0.2500 mg | INTRAMUSCULAR | Status: DC | PRN
  Administered 2021-02-24 (×2): 0.5 mg via INTRAVENOUS

## 2021-02-24 MED ORDER — METOCLOPRAMIDE HCL 5 MG/ML IJ SOLN: 5.0000 mg | Freq: Three times a day (TID) | INTRAMUSCULAR | Status: DC | PRN

## 2021-02-24 MED ORDER — ALUM & MAG HYDROXIDE-SIMETH 200-200-20 MG/5ML PO SUSP: 30.0000 mL | ORAL | Status: DC | PRN

## 2021-02-24 MED ORDER — LACTATED RINGERS IV SOLN: INTRAVENOUS | Status: DC

## 2021-02-24 MED ORDER — OXYCODONE HCL 5 MG PO TABS
10.0000 mg | ORAL_TABLET | ORAL | Status: DC | PRN
  Administered 2021-02-24 – 2021-02-25 (×2): 10 mg via ORAL

## 2021-02-24 MED ORDER — POVIDONE-IODINE 10 % EX SWAB
2.0000 "application " | Freq: Once | CUTANEOUS | Status: AC
  Administered 2021-02-24: 2 via TOPICAL

## 2021-02-24 MED ORDER — FENTANYL CITRATE (PF) 100 MCG/2ML IJ SOLN
INTRAMUSCULAR | Status: AC
  Filled 2021-02-24: qty 2

## 2021-02-24 MED ORDER — ONDANSETRON HCL 4 MG PO TABS: 4.0000 mg | ORAL_TABLET | Freq: Four times a day (QID) | ORAL | Status: DC | PRN

## 2021-02-24 MED ORDER — ASPIRIN 81 MG PO CHEW
81.0000 mg | CHEWABLE_TABLET | Freq: Two times a day (BID) | ORAL | Status: DC
  Administered 2021-02-24 – 2021-02-25 (×2): 81 mg via ORAL
  Filled 2021-02-24 (×2): qty 1

## 2021-02-24 MED ORDER — METOCLOPRAMIDE HCL 5 MG PO TABS: 5.0000 mg | ORAL_TABLET | Freq: Three times a day (TID) | ORAL | Status: DC | PRN

## 2021-02-24 MED ORDER — OXYCODONE HCL 5 MG PO TABS
ORAL_TABLET | ORAL | Status: AC
  Filled 2021-02-24: qty 1

## 2021-02-24 MED ORDER — ORAL CARE MOUTH RINSE: 15.0000 mL | Freq: Once | OROMUCOSAL | Status: AC

## 2021-02-24 MED ORDER — DIPHENHYDRAMINE HCL 12.5 MG/5ML PO ELIX: 12.5000 mg | ORAL_SOLUTION | ORAL | Status: DC | PRN

## 2021-02-24 MED ORDER — METHOCARBAMOL 500 MG PO TABS
500.0000 mg | ORAL_TABLET | Freq: Four times a day (QID) | ORAL | Status: DC | PRN
  Administered 2021-02-24 – 2021-02-25 (×2): 500 mg via ORAL
  Filled 2021-02-24: qty 1

## 2021-02-24 MED ORDER — MIDAZOLAM HCL 5 MG/5ML IJ SOLN
INTRAMUSCULAR | Status: DC | PRN
  Administered 2021-02-24: 2 mg via INTRAVENOUS

## 2021-02-24 MED ORDER — SODIUM CHLORIDE 0.9 % IR SOLN
Status: DC | PRN
  Administered 2021-02-24: 3000 mL

## 2021-02-24 MED ORDER — MENTHOL 3 MG MT LOZG: 1.0000 | LOZENGE | OROMUCOSAL | Status: DC | PRN

## 2021-02-24 MED ORDER — TRANEXAMIC ACID-NACL 1000-0.7 MG/100ML-% IV SOLN
1000.0000 mg | INTRAVENOUS | Status: AC
  Administered 2021-02-24: 1000 mg via INTRAVENOUS
  Filled 2021-02-24: qty 100

## 2021-02-24 MED ORDER — FENTANYL CITRATE (PF) 100 MCG/2ML IJ SOLN
25.0000 ug | INTRAMUSCULAR | Status: DC | PRN
  Administered 2021-02-24 (×4): 25 ug via INTRAVENOUS

## 2021-02-24 MED ORDER — METHOCARBAMOL 500 MG PO TABS
ORAL_TABLET | ORAL | Status: AC
  Filled 2021-02-24: qty 1

## 2021-02-24 MED ORDER — PROPOFOL 500 MG/50ML IV EMUL
INTRAVENOUS | Status: DC | PRN
  Administered 2021-02-24: 100 ug/kg/min via INTRAVENOUS

## 2021-02-24 MED ORDER — CEFAZOLIN SODIUM-DEXTROSE 1-4 GM/50ML-% IV SOLN
1.0000 g | Freq: Four times a day (QID) | INTRAVENOUS | Status: AC
  Administered 2021-02-24 – 2021-02-25 (×2): 1 g via INTRAVENOUS
  Filled 2021-02-24 (×2): qty 50

## 2021-02-24 MED ORDER — OXYCODONE HCL 5 MG PO TABS
5.0000 mg | ORAL_TABLET | ORAL | Status: DC | PRN
  Administered 2021-02-25: 5 mg via ORAL
  Filled 2021-02-24 (×3): qty 2

## 2021-02-24 MED ORDER — METHOCARBAMOL 1000 MG/10ML IJ SOLN
500.0000 mg | Freq: Four times a day (QID) | INTRAVENOUS | Status: DC | PRN
  Filled 2021-02-24: qty 5

## 2021-02-24 MED ORDER — LACTATED RINGERS IV SOLN: INTRAVENOUS | Status: DC | PRN

## 2021-02-24 MED ORDER — ONDANSETRON HCL 4 MG/2ML IJ SOLN: 4.0000 mg | Freq: Four times a day (QID) | INTRAMUSCULAR | Status: DC | PRN

## 2021-02-24 MED ORDER — CHLORHEXIDINE GLUCONATE 0.12 % MT SOLN: 15.0000 mL | Freq: Once | OROMUCOSAL | Status: AC

## 2021-02-24 MED ORDER — OXYCODONE HCL 5 MG/5ML PO SOLN: 5.0000 mg | Freq: Once | ORAL | Status: AC | PRN

## 2021-02-24 MED ORDER — CHLORHEXIDINE GLUCONATE 0.12 % MT SOLN
OROMUCOSAL | Status: AC
  Administered 2021-02-24: 15 mL via OROMUCOSAL
  Filled 2021-02-24: qty 15

## 2021-02-24 MED ORDER — AMISULPRIDE (ANTIEMETIC) 5 MG/2ML IV SOLN: 10.0000 mg | Freq: Once | INTRAVENOUS | Status: DC | PRN

## 2021-02-24 MED ORDER — OXYCODONE HCL 5 MG PO TABS
5.0000 mg | ORAL_TABLET | Freq: Once | ORAL | Status: AC | PRN
  Administered 2021-02-24: 5 mg via ORAL

## 2021-02-24 MED ORDER — ACETAMINOPHEN 325 MG PO TABS: 325.0000 mg | ORAL_TABLET | Freq: Four times a day (QID) | ORAL | Status: DC | PRN

## 2021-02-24 MED ORDER — ONDANSETRON HCL 4 MG/2ML IJ SOLN: 4.0000 mg | Freq: Once | INTRAMUSCULAR | Status: DC | PRN

## 2021-02-24 SURGICAL SUPPLY — 52 items
BAG COUNTER SPONGE SURGICOUNT (BAG) ×2 IMPLANT
BENZOIN TINCTURE PRP APPL 2/3 (GAUZE/BANDAGES/DRESSINGS) ×2 IMPLANT
BLADE CLIPPER SURG (BLADE) IMPLANT
BLADE SAW SGTL 18X1.27X75 (BLADE) ×2 IMPLANT
COVER SURGICAL LIGHT HANDLE (MISCELLANEOUS) ×2 IMPLANT
CUP GRIPTION SECTOR 62MM (Orthopedic Implant) ×2 IMPLANT
DRAPE C-ARM 42X72 X-RAY (DRAPES) ×2 IMPLANT
DRAPE STERI IOBAN 125X83 (DRAPES) ×2 IMPLANT
DRAPE U-SHAPE 47X51 STRL (DRAPES) ×6 IMPLANT
DRSG AQUACEL AG ADV 3.5X10 (GAUZE/BANDAGES/DRESSINGS) ×2 IMPLANT
DURAPREP 26ML APPLICATOR (WOUND CARE) ×2 IMPLANT
ELECT BLADE 4.0 EZ CLEAN MEGAD (MISCELLANEOUS) ×2
ELECT BLADE 6.5 EXT (BLADE) IMPLANT
ELECT REM PT RETURN 9FT ADLT (ELECTROSURGICAL) ×2
ELECTRODE BLDE 4.0 EZ CLN MEGD (MISCELLANEOUS) ×1 IMPLANT
ELECTRODE REM PT RTRN 9FT ADLT (ELECTROSURGICAL) ×1 IMPLANT
FACESHIELD WRAPAROUND (MASK) ×4 IMPLANT
GLOVE SRG 8 PF TXTR STRL LF DI (GLOVE) ×2 IMPLANT
GLOVE SURG LTX SZ8 (GLOVE) ×2 IMPLANT
GLOVE SURG ORTHO LTX SZ7.5 (GLOVE) ×4 IMPLANT
GLOVE SURG UNDER POLY LF SZ8 (GLOVE) ×2
GOWN STRL REUS W/ TWL LRG LVL3 (GOWN DISPOSABLE) ×2 IMPLANT
GOWN STRL REUS W/ TWL XL LVL3 (GOWN DISPOSABLE) ×2 IMPLANT
GOWN STRL REUS W/TWL LRG LVL3 (GOWN DISPOSABLE) ×2
GOWN STRL REUS W/TWL XL LVL3 (GOWN DISPOSABLE) ×2
HANDPIECE INTERPULSE COAX TIP (DISPOSABLE) ×1
HEAD CERAMIC 36 PLUS5 (Hips) ×2 IMPLANT
KIT BASIN OR (CUSTOM PROCEDURE TRAY) ×2 IMPLANT
KIT TURNOVER KIT B (KITS) ×2 IMPLANT
LINER NEUTRAL 62MMC36MM P4 (Liner) ×2 IMPLANT
MANIFOLD NEPTUNE II (INSTRUMENTS) ×2 IMPLANT
NS IRRIG 1000ML POUR BTL (IV SOLUTION) ×2 IMPLANT
PACK TOTAL JOINT (CUSTOM PROCEDURE TRAY) ×2 IMPLANT
PAD ARMBOARD 7.5X6 YLW CONV (MISCELLANEOUS) ×2 IMPLANT
SET HNDPC FAN SPRY TIP SCT (DISPOSABLE) ×1 IMPLANT
STAPLER VISISTAT 35W (STAPLE) IMPLANT
STEM CORAIL KA13 (Stem) ×2 IMPLANT
STRIP CLOSURE SKIN 1/2X4 (GAUZE/BANDAGES/DRESSINGS) ×4 IMPLANT
SUT ETHIBOND NAB CT1 #1 30IN (SUTURE) ×2 IMPLANT
SUT MNCRL AB 4-0 PS2 18 (SUTURE) IMPLANT
SUT VIC AB 0 CT1 27 (SUTURE) ×1
SUT VIC AB 0 CT1 27XBRD ANBCTR (SUTURE) ×1 IMPLANT
SUT VIC AB 1 CT1 27 (SUTURE) ×1
SUT VIC AB 1 CT1 27XBRD ANBCTR (SUTURE) ×1 IMPLANT
SUT VIC AB 2-0 CT1 27 (SUTURE) ×1
SUT VIC AB 2-0 CT1 TAPERPNT 27 (SUTURE) ×1 IMPLANT
TOWEL GREEN STERILE (TOWEL DISPOSABLE) ×2 IMPLANT
TOWEL GREEN STERILE FF (TOWEL DISPOSABLE) ×2 IMPLANT
TRAY CATH 16FR W/PLASTIC CATH (SET/KITS/TRAYS/PACK) IMPLANT
TRAY FOLEY W/BAG SLVR 16FR (SET/KITS/TRAYS/PACK)
TRAY FOLEY W/BAG SLVR 16FR ST (SET/KITS/TRAYS/PACK) IMPLANT
WATER STERILE IRR 1000ML POUR (IV SOLUTION) ×4 IMPLANT

## 2021-02-24 NOTE — Op Note (Signed)
NAMEDYSHAUN, BONZO MEDICAL RECORD NO: [id number] ACCOUNT NO: [id number] DATE OF BIRTH: Dec 06, 1960 FACILITY: [facility] LOCATION: [facility] PHYSICIAN: Lind Guest. Ninfa Linden, MD  Operative Report   DATE OF PROCEDURE: 02/24/2021  PREOPERATIVE DIAGNOSES:  Primary osteoarthritis and degenerative joint disease, right hip.  POSTOPERATIVE DIAGNOSES:  Primary osteoarthritis and degenerative joint disease, right hip.  PROCEDURE:  Right total hip arthroplasty, direct anterior approach.  IMPLANTS:  DePuy Sector Gription acetabular component size 62, size 36+4 neutral polyethylene liner, size 13 Corail femoral component with standard offset, size 36+5 ceramic hip ball.  SURGEON:  Lind Guest. Ninfa Linden, MD  ASSISTANT: Erskine Emery, PA-C  ANESTHESIA:  Spinal.  ANTIBIOTICS:  2 g IV Ancef.  BLOOD LOSS:  200 mL.  COMPLICATIONS:  None.  INDICATIONS:  The patient is a 60 year old gentleman well known to me.  I have seen him several years now for debilitating arthritis involving his right hip.  He has finally decided to proceed with hip replacement surgery due to the fact that his right  hip pain has become daily and it is detrimentally affecting his mobility, his quality of life and his activities of daily living.  He has a very stiff rotation of that right hip and his x-ray showed severe arthritis.  At this point, with the failure of  conservative treatment, he does wish to proceed and we agree with this as well.  We did talk in detail about the risk of acute blood loss anemia, nerve and vessel injury, fracture, infection, dislocation, DVT, implant failure and skin and soft tissue  issues.  We talked about our goals being decrease pain, improve mobility and overall improve quality of life.  DESCRIPTION OF PROCEDURE:  After informed consent was obtained, appropriate right hip was marked, he was brought to the operating room and sat up on the stretcher where spinal anesthesia was obtained, he was laid in  supine position on the stretcher.  We  assessed his leg length and he is slightly shorter on this right side.  A Foley catheter was placed as well.  Next, traction boots were placed on both his feet.  He was placed supine on the Hana fracture table, with the perineal post in place and both  legs in line skeletal traction device and no traction applied.  His right operative hip was prepped and draped with DuraPrep and sterile drapes.  A timeout was called and he was identified correct patient, correct right hip.  We then made an incision  just inferior and posterior to the anterior superior iliac spine and carried this obliquely down the leg.  We dissected down tensor fascia lata muscle.  Tensor fascia was then divided longitudinally to proceed with direct anterior approach to the hip.   We identified and cauterized circumflex vessels and identified the hip capsule, opened up the hip capsule in L-type format, finding a moderate joint effusion.  There was definitely lateral periarticular osteophytes around the lateral femoral head and  neck.  We then placed Cobra retractors within the joint capsule around the medial and lateral femoral neck and made our femoral neck cut with an oscillating saw just proximal to the lesser trochanter.  We completed this with an osteotome.  We placed a  corkscrew guide in the femoral head and removed the femoral head in its entirety.  It was a very large femoral head and did have significant cartilage wear.  I then placed a bent Hohmann over the medial acetabular rim and removed remnants of the  acetabular labrum  and other debris, then began reaming under direct visualization from a small reaming size all the way up to a size 61, with all reamers placed under direct visualization, the last reamer was placed under direct fluoroscopy, so we could  obtain our depth of reaming, our inclination and anteversion.  I then placed a real DePuy Sector Gription acetabular component size 62  and a 36+4 neutral polyethylene liner for that size 62 acetabular component.  Attention was then turned to the femur.   With the leg externally rotated to 120 degrees, extended and adducted, we were able to place a Mueller retractor medially and a Hohmann retractor behind the greater trochanter.  We released the lateral joint capsule and used a box cutting osteotome to  enter the femoral canal and a rongeur to lateralize, then began broaching using the Corail broaching system from a size 8 going up to a size 13 in stepwise increments.  With the size 13 in place, we trialed a trial standard offset femoral neck and a  36+1.5 hip ball, reduced this in acetabulum and it was stable.  We felt like we needed just a little bit more leg length.  We dislocated the hip, removed the trial components.  We placed the real Corail femoral component size 13 with standard offset and  we went with a final hip ball, this was 36+5 ceramic hip ball, again we reduced this in the acetabulum.  We were pleased with leg length, offset, range of motion and stability, assessed clinically and radiographically.  We then irrigated the soft tissue  with normal saline solution using pulsatile lavage.  We reapproximated the joint capsule with interrupted #1 Ethibond suture.  #1 Vicryl was used to close the tensor fascia, 0 Vicryl was used to close deep tissue and 2-0 Vicryl was used to close the  subcutaneous tissue.  The skin was closed with staples.  An Aquacel dressing was applied.  He was taken off the Hana table and taken to recovery room in stable condition with all final counts being correct.  No complications noted.  Of note, Benita Stabile,  PA-C, did assist during the entire case.  His assistance was crucial for facilitating every aspect of this case.   SHW D: 02/24/2021 4:51:44 pm T: 02/24/2021 9:58:00 pm  JOB: [id number]/ [id number]

## 2021-02-24 NOTE — Anesthesia Preprocedure Evaluation (Signed)
Anesthesia Evaluation  Patient identified by MRN, date of birth, ID band Patient awake    Reviewed: Allergy & Precautions, NPO status , Patient's Chart, lab work & pertinent test results  History of Anesthesia Complications Negative for: history of anesthetic complications  Airway Mallampati: III  TM Distance: >3 FB Neck ROM: Full    Dental  (+) Teeth Intact   Pulmonary neg pulmonary ROS, former smoker,    Pulmonary exam normal        Cardiovascular negative cardio ROS Normal cardiovascular exam     Neuro/Psych negative neurological ROS     GI/Hepatic negative GI ROS, Neg liver ROS,   Endo/Other  negative endocrine ROS  Renal/GU negative Renal ROS  negative genitourinary   Musculoskeletal  (+) Arthritis , Osteoarthritis,    Abdominal   Peds  Hematology negative hematology ROS (+)   Anesthesia Other Findings  plts 273  Reproductive/Obstetrics                            Anesthesia Physical Anesthesia Plan  ASA: 2  Anesthesia Plan: Spinal   Post-op Pain Management:    Induction:   PONV Risk Score and Plan: 1 and Propofol infusion, Treatment may vary due to age or medical condition, Ondansetron and TIVA  Airway Management Planned: Nasal Cannula and Simple Face Mask  Additional Equipment: None  Intra-op Plan:   Post-operative Plan:   Informed Consent: I have reviewed the patients History and Physical, chart, labs and discussed the procedure including the risks, benefits and alternatives for the proposed anesthesia with the patient or authorized representative who has indicated his/her understanding and acceptance.       Plan Discussed with:   Anesthesia Plan Comments:         Anesthesia Quick Evaluation

## 2021-02-24 NOTE — Anesthesia Procedure Notes (Signed)
Procedure Name: MAC Date/Time: 02/24/2021 3:35 PM Performed by: Eligha Bridegroom, CRNA Pre-anesthesia Checklist: Patient identified, Emergency Drugs available, Suction available, Patient being monitored and Timeout performed Patient Re-evaluated:Patient Re-evaluated prior to induction Oxygen Delivery Method: Nasal cannula Preoxygenation: Pre-oxygenation with 100% oxygen

## 2021-02-24 NOTE — Transfer of Care (Signed)
Immediate Anesthesia Transfer of Care Note  Patient: Elijah Vargas  Procedure(s) Performed: RIGHT TOTAL HIP ARTHROPLASTY ANTERIOR APPROACH (Right: Hip)  Patient Location: PACU  Anesthesia Type:Spinal  Level of Consciousness: awake, alert  and oriented  Airway & Oxygen Therapy: Patient Spontanous Breathing  Post-op Assessment: Report given to RN and Post -op Vital signs reviewed and stable  Post vital signs: Reviewed and stable  Last Vitals:  Vitals Value Taken Time  BP 108/74 02/24/21 1704  Temp 36.2 C 02/24/21 1705  Pulse 64 02/24/21 1705  Resp 17 02/24/21 1705  SpO2 97 % 02/24/21 1705  Vitals shown include unvalidated device data.  Last Pain:  Vitals:   02/24/21 1305  TempSrc:   PainSc: 0-No pain         Complications: No notable events documented.

## 2021-02-24 NOTE — Anesthesia Postprocedure Evaluation (Signed)
Anesthesia Post Note  Patient: Elijah Vargas  Procedure(s) Performed: RIGHT TOTAL HIP ARTHROPLASTY ANTERIOR APPROACH (Right: Hip)     Patient location during evaluation: PACU Anesthesia Type: Spinal Level of consciousness: oriented and awake and alert Pain management: pain level controlled Vital Signs Assessment: post-procedure vital signs reviewed and stable Respiratory status: spontaneous breathing, respiratory function stable and nonlabored ventilation Cardiovascular status: blood pressure returned to baseline and stable Postop Assessment: no headache, no backache, no apparent nausea or vomiting and spinal receding Anesthetic complications: no   No notable events documented.  Last Vitals:  Vitals:   02/24/21 1236 02/24/21 1705  BP: 122/86   Pulse: 85   Resp: 18   Temp: 36.9 C (!) 36.2 C  SpO2: 99%     Last Pain:  Vitals:   02/24/21 1305  TempSrc:   PainSc: 0-No pain                 Lidia Collum

## 2021-02-24 NOTE — Progress Notes (Signed)
PT Cancellation Note  Patient Details Name: Ronte Parker MRN: [id number] DOB: 05/10/1961   Cancelled Treatment:    Reason Eval/Treat Not Completed: Other (comment)  Noted pt just finished surgery with anesthesia end time of 1706.  Will f/u tomorrow for PT eval  Abran Richard, PT Acute Rehab Services Pager [phone] [facility] [place] 02/24/2021, 5:08 PM

## 2021-02-24 NOTE — H&P (Signed)
TOTAL HIP ADMISSION H&P  Patient is admitted for right total hip arthroplasty.  Subjective:  Chief Complaint: right hip pain  HPI: Elijah Vargas, 60 y.o. male, has a history of pain and functional disability in the right hip(s) due to arthritis and patient has failed non-surgical conservative treatments for greater than 12 weeks to include NSAID's and/or analgesics, corticosteriod injections, weight reduction as appropriate, and activity modification.  Onset of symptoms was gradual starting 3 years ago with gradually worsening course since that time.The patient noted no past surgery on the right hip(s).  Patient currently rates pain in the right hip at 9 out of 10 with activity. Patient has night pain, worsening of pain with activity and weight bearing, pain that interfers with activities of daily living, and pain with passive range of motion. Patient has evidence of subchondral sclerosis, periarticular osteophytes, and joint space narrowing by imaging studies. This condition presents safety issues increasing the risk of falls.  There is no current active infection.  Patient Active Problem List   Diagnosis Date Noted   Unilateral primary osteoarthritis, right hip 12/11/2019   Right hip pain 10/30/2019   Chronic midline low back pain without sciatica 10/30/2019   Neuropathic pain of both feet 10/30/2019   Kidney stones 05/22/2018   IFG (impaired fasting glucose) 05/11/2017   Hypogonadism in male 01/07/2017   Past Medical History:  Diagnosis Date   Arthritis    History of kidney stones     Past Surgical History:  Procedure Laterality Date   TONSILLECTOMY      Current Facility-Administered Medications  Medication Dose Route Frequency Provider Last Rate Last Admin   [START ON 02/25/2021] ceFAZolin (ANCEF) IVPB 2g/100 mL premix  2 g Intravenous On Call to OR Pete Pelt, PA-C       lactated ringers infusion   Intravenous Continuous Lidia Collum, MD 10 mL/hr at 02/24/21 1333 New  Bag at 02/24/21 1333   tranexamic acid (CYKLOKAPRON) IVPB 1,000 mg  1,000 mg Intravenous To OR Pete Pelt, PA-C       No Known Allergies  Social History   Tobacco Use   Smoking status: Former    Pack years: 0.00    Types: Cigarettes    Quit date: 03/16/1992    Years since quitting: 28.9   Smokeless tobacco: Never  Substance Use Topics   Alcohol use: Yes    Alcohol/week: 4.0 standard drinks    Types: 4 Cans of beer per week    Family History  Problem Relation Age of Onset   Breast cancer Other        grandmother   Diabetes Mother    Hypertension Father    Alcohol abuse Brother      Review of Systems  Musculoskeletal:  Positive for gait problem.  All other systems reviewed and are negative.  Objective:  Physical Exam Vitals reviewed.  Constitutional:      Appearance: Normal appearance.  HENT:     Head: Normocephalic and atraumatic.  Eyes:     Extraocular Movements: Extraocular movements intact.     Pupils: Pupils are equal, round, and reactive to light.  Cardiovascular:     Rate and Rhythm: Normal rate and regular rhythm.     Pulses: Normal pulses.     Heart sounds: Normal heart sounds.  Pulmonary:     Effort: Pulmonary effort is normal.     Breath sounds: Normal breath sounds.  Abdominal:     Palpations: Abdomen is soft.  Musculoskeletal:  Cervical back: Normal range of motion and neck supple.     Right hip: Tenderness and bony tenderness present. Decreased range of motion. Decreased strength.  Neurological:     Mental Status: He is alert and oriented to person, place, and time.  Psychiatric:        Behavior: Behavior normal.    Vital signs in last 24 hours: Temp:  [98.5 F (36.9 C)] 98.5 F (36.9 C) (07/12 1236) Pulse Rate:  [85] 85 (07/12 1236) Resp:  [18] 18 (07/12 1236) BP: (122)/(86) 122/86 (07/12 1236) SpO2:  [99 %] 99 % (07/12 1236) Weight:  [113.4 kg] 113.4 kg (07/12 1236)  Labs:   Estimated body mass index is 31.25 kg/m as  calculated from the following:   Height as of this encounter: 6\' 3"  (1.905 m).   Weight as of this encounter: 113.4 kg.   Imaging Review Plain radiographs demonstrate severe degenerative joint disease of the right hip(s). The bone quality appears to be excellent for age and reported activity level.      Assessment/Plan:  End stage arthritis, right hip(s)  The patient history, physical examination, clinical judgement of the provider and imaging studies are consistent with end stage degenerative joint disease of the right hip(s) and total hip arthroplasty is deemed medically necessary. The treatment options including medical management, injection therapy, arthroscopy and arthroplasty were discussed at length. The risks and benefits of total hip arthroplasty were presented and reviewed. The risks due to aseptic loosening, infection, stiffness, dislocation/subluxation,  thromboembolic complications and other imponderables were discussed.  The patient acknowledged the explanation, agreed to proceed with the plan and consent was signed. Patient is being admitted for inpatient treatment for surgery, pain control, PT, OT, prophylactic antibiotics, VTE prophylaxis, progressive ambulation and ADL's and discharge planning.The patient is planning to be discharged home with home health services

## 2021-02-24 NOTE — Brief Op Note (Signed)
02/24/2021  4:53 PM  PATIENT:  Elijah Vargas  60 y.o. male  PRE-OPERATIVE DIAGNOSIS:  Osteoarthritis Right Hip  POST-OPERATIVE DIAGNOSIS:  Osteoarthritis Right Hip  PROCEDURE:  Procedure(s) with comments: RIGHT TOTAL HIP ARTHROPLASTY ANTERIOR APPROACH (Right) - RIGHT TOTAL HIP ARTHROPLASTY ANTERIOR APPROACH  SURGEON:  Surgeon(s) and Role:    Mcarthur Rossetti, MD - Primary  PHYSICIAN ASSISTANT:  Benita Stabile, PA-C  ANESTHESIA:   spinal  EBL:  200 mL   COUNTS:  YES  PLAN OF CARE: Admit for overnight observation  PATIENT DISPOSITION:  PACU - hemodynamically stable.   Delay start of Pharmacological VTE agent (>24hrs) due to surgical blood loss or risk of bleeding: no

## 2021-02-24 NOTE — Anesthesia Procedure Notes (Signed)
Spinal  Patient location during procedure: OR Reason for block: surgical anesthesia Staffing Performed: anesthesiologist  Anesthesiologist: Chevie Birkhead E, MD Preanesthetic Checklist Completed: patient identified, IV checked, risks and benefits discussed, surgical consent, monitors and equipment checked, pre-op evaluation and timeout performed Spinal Block Patient position: sitting Prep: DuraPrep and site prepped and draped Patient monitoring: continuous pulse ox, blood pressure and heart rate Approach: midline Location: L3-4 Injection technique: single-shot Needle Needle type: Pencan  Needle gauge: 24 G Needle length: 9 cm Assessment Events: CSF return Additional Notes Functioning IV was confirmed and monitors were applied. Sterile prep and drape, including hand hygiene and sterile gloves were used. The patient was positioned and the spine was prepped. The skin was anesthetized with lidocaine.  Free flow of clear CSF was obtained prior to injecting local anesthetic into the CSF. The needle was carefully withdrawn. The patient tolerated the procedure well.     

## 2021-02-25 ENCOUNTER — Encounter (HOSPITAL_COMMUNITY): Payer: Self-pay | Admitting: Orthopaedic Surgery

## 2021-02-25 ENCOUNTER — Telehealth: Payer: Self-pay

## 2021-02-25 DIAGNOSIS — M1611 Unilateral primary osteoarthritis, right hip: Secondary | ICD-10-CM | POA: Diagnosis not present

## 2021-02-25 LAB — CBC
HCT: 38.8 % — ABNORMAL LOW (ref 39.0–52.0)
Hemoglobin: 13.4 g/dL (ref 13.0–17.0)
MCH: 30.6 pg (ref 26.0–34.0)
MCHC: 34.5 g/dL (ref 30.0–36.0)
MCV: 88.6 fL (ref 80.0–100.0)
Platelets: 230 10*3/uL (ref 150–400)
RBC: 4.38 MIL/uL (ref 4.22–5.81)
RDW: 13.3 % (ref 11.5–15.5)
WBC: 13.8 10*3/uL — ABNORMAL HIGH (ref 4.0–10.5)
nRBC: 0 % (ref 0.0–0.2)

## 2021-02-25 LAB — BASIC METABOLIC PANEL
Anion gap: 9 (ref 5–15)
BUN: 11 mg/dL (ref 6–20)
CO2: 22 mmol/L (ref 22–32)
Calcium: 8.6 mg/dL — ABNORMAL LOW (ref 8.9–10.3)
Chloride: 104 mmol/L (ref 98–111)
Creatinine, Ser: 1 mg/dL (ref 0.61–1.24)
GFR, Estimated: >60 mL/min (ref >60)
Glucose, Bld: 147 mg/dL — ABNORMAL HIGH (ref 70–99)
Potassium: 4.3 mmol/L (ref 3.5–5.1)
Sodium: 135 mmol/L (ref 135–145)

## 2021-02-25 MED ORDER — ASPIRIN 81 MG PO CHEW: 81.0000 mg | CHEWABLE_TABLET | Freq: Two times a day (BID) | ORAL | 0 refills | Status: AC

## 2021-02-25 MED ORDER — METHOCARBAMOL 500 MG PO TABS: 500.0000 mg | ORAL_TABLET | Freq: Four times a day (QID) | ORAL | 1 refills | Status: DC | PRN

## 2021-02-25 MED ORDER — OXYCODONE HCL 5 MG PO TABS: 5.0000 mg | ORAL_TABLET | Freq: Four times a day (QID) | ORAL | 0 refills | Status: DC | PRN

## 2021-02-25 NOTE — Evaluation (Signed)
Physical Therapy Evaluation Patient Details Name: Elijah Vargas MRN: [id number] DOB: 1960/12/21 Today's Date: 02/25/2021   History of Present Illness  Pt admitted on 7/12 for elective R direct anterior THA. PMH: arthritis  Clinical Impression  Pt admitted with above. Pt mildly anxious regarding pain with movement but able to ambulate in hallway and complete exercises with minA. Pt given HEP. PT to see again and complete stair negotiation.    Follow Up Recommendations Follow surgeon's recommendation for DC plan and follow-up therapies    Equipment Recommendations  Rolling walker with 5" wheels;3in1 (PT)    Recommendations for Other Services       Precautions / Restrictions Restrictions Weight Bearing Restrictions: Yes RLE Weight Bearing: Weight bearing as tolerated      Mobility  Bed Mobility Overal bed mobility: Needs Assistance Bed Mobility: Supine to Sit     Supine to sit: Min guard;HOB elevated     General bed mobility comments: increased time, verbal cues for long sit technique    Transfers Overall transfer level: Needs assistance Equipment used: Rolling walker (2 wheeled) Transfers: Sit to/from Stand Sit to Stand: Min assist         General transfer comment: minA to power up, verbal cues for hand placement, bed elevated  Ambulation/Gait Ambulation/Gait assistance: Min guard Gait Distance (Feet): 120 Feet Assistive device: Rolling walker (2 wheeled) Gait Pattern/deviations: Step-through pattern;Decreased stride length Gait velocity: slow Gait velocity interpretation: <1.31 ft/sec, indicative of household ambulator General Gait Details: pt initially requiring max directional verbal cues for sequencing stepping pattern, pt then transitioned to short step length but sequential gait pattern, pt wiht noted significant UE support on RW wiht minimal R LE WBing, pt encouraged to increased R LE WBing  Stairs            Wheelchair Mobility    Modified  Rankin (Stroke Patients Only)       Balance Overall balance assessment: Mild deficits observed, not formally tested                                           Pertinent Vitals/Pain Pain Assessment: 0-10 Pain Score: 5  Pain Location: R hipe Pain Descriptors / Indicators: Constant;Discomfort Pain Intervention(s): Monitored during session;Premedicated before session    Mandan expects to be discharged to:: Private residence Living Arrangements: Spouse/significant other Available Help at Discharge: Family;Available 24 hours/day (wife taking off the next 2 weeks) Type of Home: House Home Access: Stairs to enter Entrance Stairs-Rails: None Entrance Stairs-Number of Steps: 2 Home Layout: Two level;Able to live on main level with bedroom/bathroom Home Equipment: None      Prior Function Level of Independence: Independent         Comments: working     Hand Dominance   Dominant Hand: Right    Extremity/Trunk Assessment        Lower Extremity Assessment Lower Extremity Assessment: RLE deficits/detail RLE Deficits / Details: foot and knee WFL, minimal active hip flex due to pain    Cervical / Trunk Assessment Cervical / Trunk Assessment: Normal  Communication   Communication: No difficulties  Cognition Arousal/Alertness: Awake/alert Behavior During Therapy: WFL for tasks assessed/performed Overall Cognitive Status: Within Functional Limits for tasks assessed  General Comments: pt with noted cautious/guarding due to fear/anxiety regarding potential increased pain with mobiltiy      General Comments General comments (skin integrity, edema, etc.): R hip dressing intact, SpO2 >94% on RA    Exercises General Exercises - Lower Extremity Ankle Circles/Pumps: AROM;Both;10 reps;Supine Quad Sets: AROM;Right;10 reps;Supine (with 5 sec hold) Gluteal Sets: AROM;Both;10 reps;Supine Long Arc  Quad: AROM;AAROM;Right;10 reps;Seated Heel Slides: AROM;AAROM;Right;10 reps;Seated Hip Flexion/Marching: AAROM;Right;10 reps;Seated   Assessment/Plan    PT Assessment Patient needs continued PT services  PT Problem List Decreased strength;Decreased range of motion;Decreased activity tolerance;Decreased balance;Decreased mobility;Decreased coordination;Decreased cognition;Decreased knowledge of use of DME       PT Treatment Interventions DME instruction;Gait training;Stair training;Functional mobility training;Therapeutic activities;Balance training;Therapeutic exercise;Neuromuscular re-education;Cognitive remediation    PT Goals (Current goals can be found in the Care Plan section)  Acute Rehab PT Goals Patient Stated Goal: home PT Goal Formulation: With patient Time For Goal Achievement: 03/11/21 Potential to Achieve Goals: Good    Frequency 7X/week   Barriers to discharge        Co-evaluation               AM-PAC PT "6 Clicks" Mobility  Outcome Measure Help needed turning from your back to your side while in a flat bed without using bedrails?: A Little Help needed moving from lying on your back to sitting on the side of a flat bed without using bedrails?: A Little Help needed moving to and from a bed to a chair (including a wheelchair)?: A Little Help needed standing up from a chair using your arms (e.g., wheelchair or bedside chair)?: A Little Help needed to walk in hospital room?: A Little Help needed climbing 3-5 steps with a railing? : A Little 6 Click Score: 18    End of Session Equipment Utilized During Treatment: Gait belt Activity Tolerance: Patient tolerated treatment well Patient left: in chair;with call bell/phone within reach Nurse Communication: Mobility status PT Visit Diagnosis: Unsteadiness on feet (R26.81);Muscle weakness (generalized) (M62.81);Difficulty in walking, not elsewhere classified (R26.2)    Time: 9563-8756 PT Time Calculation (min)  (ACUTE ONLY): 35 min   Charges:   PT Evaluation $PT Eval Moderate Complexity: 1 Mod PT Treatments $Gait Training: 8-22 mins        Kittie Plater, PT, DPT Acute Rehabilitation Services Pager #: [phone] Office #: [phone]   Berline Lopes 02/25/2021, 10:33 AM

## 2021-02-25 NOTE — Telephone Encounter (Signed)
Pts wife called stating that the pharm needs prior auth before they fill pts medication for th oxycodone 5 mg

## 2021-02-25 NOTE — Plan of Care (Signed)
  Problem: Education: Goal: Required Educational Video(s) Outcome: Progressing   Problem: Clinical Measurements: Goal: Ability to maintain clinical measurements within normal limits will improve Outcome: Progressing Goal: Postoperative complications will be avoided or minimized Outcome: Progressing   Problem: Skin Integrity: Goal: Demonstration of wound healing without infection will improve Outcome: Progressing   

## 2021-02-25 NOTE — Telephone Encounter (Signed)
Already submitted this.waiting on response

## 2021-02-25 NOTE — Progress Notes (Signed)
Physical Therapy Treatment Patient Details Name: Elijah Vargas MRN: [id number] DOB: [birth year] Today's Date: 02/25/2021    History of Present Illness Pt admitted on 7/12 for elective R direct anterior THA. PMH: arthritis    PT Comments    Pt with increased ambulation tolerance and R LE WBing tolerance this session. Completed stair negotiation and wife return demonstrated good technique for assisting pt on the stairs. From mobility standpoint pt safe to d/c home with assist from spouse once cleared by MD.Acute PT to cont to follow.    Follow Up Recommendations  Follow surgeon's recommendation for DC plan and follow-up therapies (would benefit from HHPT initially and then progress to outpt PT)     Equipment Recommendations  Rolling walker with 5" wheels;3in1 (PT)    Recommendations for Other Services       Precautions / Restrictions Precautions Precautions: None Restrictions Weight Bearing Restrictions: Yes RLE Weight Bearing: Weight bearing as tolerated    Mobility  Bed Mobility Overal bed mobility: Needs Assistance Bed Mobility: Sit to Supine     Supine to sit: Min guard;HOB elevated Sit to supine: Min assist   General bed mobility comments: minA to elevate R LE up into bed due to weak R hip flexor    Transfers Overall transfer level: Needs assistance Equipment used: Rolling walker (2 wheeled) Transfers: Sit to/from Stand Sit to Stand: Min guard         General transfer comment: min guard for safety, v/c's for hand placement  Ambulation/Gait Ambulation/Gait assistance: Min guard Gait Distance (Feet): 200 Feet Assistive device: Rolling walker (2 wheeled) Gait Pattern/deviations: Step-through pattern;Decreased stride length Gait velocity: slow Gait velocity interpretation: <1.31 ft/sec, indicative of household ambulator General Gait Details: verbal cues for fluid, reciprocal gait pattern vs step to gait pattern, encouragage bending R hip and knee during  swing phase as well   Stairs Stairs: Yes Stairs assistance: Min assist Stair Management: One rail Left;Step to pattern;Backwards (with HHA on the R side) Number of Stairs: 8 General stair comments: verbal cues for "up with the good, down with the bad" sequencing, wife present and return demonstrated on how to safely assist pt on the R side via HHA up/down stairs   Wheelchair Mobility    Modified Rankin (Stroke Patients Only)       Balance Overall balance assessment: Mild deficits observed, not formally tested                                          Cognition Arousal/Alertness: Awake/alert Behavior During Therapy: WFL for tasks assessed/performed Overall Cognitive Status: Within Functional Limits for tasks assessed                                 General Comments: pt with noted cautious/guarding due to fear/anxiety regarding potential increased pain with mobiltiy      Exercises General Exercises - Lower Extremity Ankle Circles/Pumps: AROM;Both;10 reps;Supine Quad Sets: AROM;Right;10 reps;Supine (with 5 sec hold) Gluteal Sets: AROM;Both;10 reps;Supine Long Arc Quad: AROM;AAROM;Right;10 reps;Seated Heel Slides: AROM;AAROM;Right;10 reps;Seated Hip Flexion/Marching: AAROM;Right;10 reps;Seated    General Comments General comments (skin integrity, edema, etc.): WFL      Pertinent Vitals/Pain Pain Assessment: 0-10 Pain Score: 5  Pain Location: R hip Pain Descriptors / Indicators: Constant;Discomfort Pain Intervention(s): Monitored during session    Home Living Family/patient expects  to be discharged to:: Private residence Living Arrangements: Spouse/significant other Available Help at Discharge: Family;Available 24 hours/day (wife taking off the next 2 weeks) Type of Home: House Home Access: Stairs to enter Entrance Stairs-Rails: None Home Layout: Two level;Able to live on main level with bedroom/bathroom Home Equipment: None       Prior Function Level of Independence: Independent      Comments: working   PT Goals (current goals can now be found in the care plan section) Acute Rehab PT Goals Patient Stated Goal: home PT Goal Formulation: With patient Time For Goal Achievement: 03/11/21 Potential to Achieve Goals: Good Progress towards PT goals: Progressing toward goals    Frequency    7X/week      PT Plan Current plan remains appropriate    Co-evaluation              AM-PAC PT "6 Clicks" Mobility   Outcome Measure  Help needed turning from your back to your side while in a flat bed without using bedrails?: A Little Help needed moving from lying on your back to sitting on the side of a flat bed without using bedrails?: A Little Help needed moving to and from a bed to a chair (including a wheelchair)?: A Little Help needed standing up from a chair using your arms (e.g., wheelchair or bedside chair)?: A Little Help needed to walk in hospital room?: A Little Help needed climbing 3-5 steps with a railing? : A Little 6 Click Score: 18    End of Session Equipment Utilized During Treatment: Gait belt Activity Tolerance: Patient tolerated treatment well Patient left: in bed;with call bell/phone within reach;with family/visitor present Nurse Communication: Mobility status PT Visit Diagnosis: Unsteadiness on feet (R26.81);Muscle weakness (generalized) (M62.81);Difficulty in walking, not elsewhere classified (R26.2)     Time: 1131-1200 PT Time Calculation (min) (ACUTE ONLY): 29 min  Charges:  $Gait Training: 23-37 mins                     Kittie Plater, PT, DPT Acute Rehabilitation Services Pager #: [phone] Office #: [phone]    Berline Lopes 02/25/2021, 12:42 PM

## 2021-02-25 NOTE — Progress Notes (Addendum)
Subjective: 1 Day Post-Op Procedure(s) (LRB): RIGHT TOTAL HIP ARTHROPLASTY ANTERIOR APPROACH (Right) Patient reports pain as moderate.  Up walking in hallway with PT. No complaints.   Objective: Vital signs in last 24 hours: Temp:  [97.2 F (36.2 C)-99.9 F (37.7 C)] 98.8 F (37.1 C) (07/13 0752) Pulse Rate:  [63-106] 82 (07/13 0752) Resp:  [10-19] 16 (07/13 0752) BP: (102-137)/(65-99) 110/85 (07/13 0752) SpO2:  [95 %-100 %] 96 % (07/13 0752) Weight:  [113.4 kg] 113.4 kg (07/12 1236)  Intake/Output from previous day: 07/12 0701 - 07/13 0700 In: 1916.7 [P.O.:95; I.V.:1621.7; IV Piggyback:200] Out: 1375 [Urine:1175; Blood:200] Intake/Output this shift: No intake/output data recorded.  Recent Labs    02/25/21 0233  HGB 13.4   Recent Labs    02/25/21 0233  WBC 13.8*  RBC 4.38  HCT 38.8*  PLT 230   Recent Labs    02/25/21 0233  NA 135  K 4.3  CL 104  CO2 22  BUN 11  CREATININE 1.00  GLUCOSE 147*  CALCIUM 8.6*   No results for input(s): LABPT, INR in the last 72 hours.  General no acute distress. Ambulating with PT.  Dressing clean dry and intact. Calf supple and non tender.  Dorsi/plantarflexion intact.    Assessment/Plan: 1 Day Post-Op Procedure(s) (LRB): RIGHT TOTAL HIP ARTHROPLASTY ANTERIOR APPROACH (Right) Up with therapy Possible discharge to home later today if does will with PT.      Adib Wahba 02/25/2021, 8:21 AM

## 2021-02-25 NOTE — Discharge Summary (Signed)
Patient ID: Hau Sanor MRN: [id number] DOB/AGE: 1960/08/27 61 y.o.  Admit date: 02/24/2021 Discharge date: 02/25/2021  Admission Diagnoses:  Principal Problem:   Unilateral primary osteoarthritis, right hip Active Problems:   Status post total replacement of right hip   Discharge Diagnoses:  Same  Past Medical History:  Diagnosis Date   Arthritis    History of kidney stones     Surgeries: Procedure(s): RIGHT TOTAL HIP ARTHROPLASTY ANTERIOR APPROACH on 02/24/2021   Consultants:   Discharged Condition: Improved  Hospital Course: Namon Villarin is an 60 y.o. male who was admitted 02/24/2021 for operative treatment ofUnilateral primary osteoarthritis, right hip. Patient has severe unremitting pain that affects sleep, daily activities, and work/hobbies. After pre-op clearance the patient was taken to the operating room on 02/24/2021 and underwent  Procedure(s): RIGHT TOTAL HIP ARTHROPLASTY ANTERIOR APPROACH.    Patient was given perioperative antibiotics:  Anti-infectives (From admission, onward)    Start     Dose/Rate Route Frequency Ordered Stop   02/25/21 0600  ceFAZolin (ANCEF) IVPB 2g/100 mL premix        2 g 200 mL/hr over 30 Minutes Intravenous On call to O.R. 02/24/21 1245 02/24/21 1545   02/24/21 2145  ceFAZolin (ANCEF) IVPB 1 g/50 mL premix        1 g 100 mL/hr over 30 Minutes Intravenous Every 6 hours 02/24/21 2039 02/25/21 0559        Patient was given sequential compression devices, early ambulation, and chemoprophylaxis to prevent DVT.  Patient benefited maximally from hospital stay and there were no complications.    Recent vital signs: Patient Vitals for the past 24 hrs:  BP Temp Temp src Pulse Resp SpO2 Height Weight  02/25/21 0752 110/85 98.8 F (37.1 C) Oral 82 16 96 % -- --  02/25/21 0532 102/66 99.5 F (37.5 C) Oral 71 16 98 % -- --  02/25/21 0100 115/72 98.3 F (36.8 C) Oral 85 18 97 % -- --  02/24/21 2041 114/75 99.9 F (37.7 C) Oral 79 19  98 % -- --  02/24/21 2022 137/87 98.8 F (37.1 C) -- 84 18 96 % -- --  02/24/21 2005 119/65 -- -- 81 16 96 % -- --  02/24/21 1935 116/71 -- -- 85 15 99 % -- --  02/24/21 1905 121/69 (!) 97.2 F (36.2 C) -- 79 13 95 % -- --  02/24/21 1850 126/76 -- -- 72 12 97 % -- --  02/24/21 1835 107/68 -- -- 73 15 100 % -- --  02/24/21 1820 (!) 129/99 -- -- 69 10 100 % -- --  02/24/21 1805 124/89 -- -- 70 13 95 % -- --  02/24/21 1750 110/87 -- -- 75 11 99 % -- --  02/24/21 1735 118/85 -- -- (!) 106 13 100 % -- --  02/24/21 1720 123/82 -- -- 63 10 100 % -- --  02/24/21 1705 108/74 (!) 97.2 F (36.2 C) -- 65 15 100 % -- --  02/24/21 1236 122/86 98.5 F (36.9 C) Oral 85 18 99 % 6\' 3"  (1.905 m) 113.4 kg     Recent laboratory studies:  Recent Labs    02/25/21 0233  WBC 13.8*  HGB 13.4  HCT 38.8*  PLT 230  NA 135  K 4.3  CL 104  CO2 22  BUN 11  CREATININE 1.00  GLUCOSE 147*  CALCIUM 8.6*     Discharge Medications:   Allergies as of 02/25/2021   No Known Allergies  Medication List     TAKE these medications    acetaminophen 325 MG tablet Commonly known as: TYLENOL Take 325 mg by mouth daily.   aspirin 81 MG chewable tablet Chew 1 tablet (81 mg total) by mouth 2 (two) times daily.   COQ-10 PO Take 1 tablet by mouth daily.   GLUCOSAMINE PO Take 1 tablet by mouth daily.   methocarbamol 500 MG tablet Commonly known as: ROBAXIN Take 1 tablet (500 mg total) by mouth every 6 (six) hours as needed for muscle spasms.   multivitamin with minerals Tabs tablet Take 1 tablet by mouth daily.   oxyCODONE 5 MG immediate release tablet Commonly known as: Oxy IR/ROXICODONE Take 1-2 tablets (5-10 mg total) by mouth every 6 (six) hours as needed for moderate pain (pain score 4-6).               Durable Medical Equipment  (From admission, onward)           Start     Ordered   02/24/21 2040  DME 3 n 1  Once        02/24/21 2039   02/24/21 2040  DME Walker rolling   Once       Question Answer Comment  Walker: With 5 Inch Wheels   Patient needs a walker to treat with the following condition Status post total replacement of right hip      02/24/21 2039            Diagnostic Studies: DG Pelvis Portable  Result Date: 02/24/2021 CLINICAL DATA:  Status post right hip replacement EXAM: PORTABLE PELVIS 1 VIEWS COMPARISON:  Intraoperative films from earlier in the same day. FINDINGS: Right hip prosthesis is noted in satisfactory position. Pelvic ring is intact. No acute bony or soft tissue abnormality is seen. IMPRESSION: Status post right hip replacement. Electronically Signed   By: Inez Catalina M.D.   On: 02/24/2021 19:06   DG C-Arm 1-60 Min  Result Date: 02/24/2021 CLINICAL DATA:  Surgery, elective. Additional history provided: Right anterior total hip arthroplasty. Provided fluoroscopy time 24 seconds (3.04 mGy). EXAM: OPERATIVE right HIP (WITH PELVIS IF PERFORMED) 3 VIEWS TECHNIQUE: Fluoroscopic spot image(s) were submitted for interpretation post-operatively. COMPARISON:  Radiographs of the right hip 01/20/2021. FINDINGS: Three intraoperative fluoroscopic images of the right hip are submitted. On the provided images, there are findings of interval right total hip arthroplasty. The femoral and acetabular components appear well seated. No unexpected finding On the provided views. IMPRESSION: Three intraoperative fluoroscopic images of the right hip from right total hip arthroplasty, as described. Electronically Signed   By: Kellie Simmering DO   On: 02/24/2021 17:56   DG HIP OPERATIVE UNILAT WITH PELVIS RIGHT  Result Date: 02/24/2021 CLINICAL DATA:  Surgery, elective. Additional history provided: Right anterior total hip arthroplasty. Provided fluoroscopy time 24 seconds (3.04 mGy). EXAM: OPERATIVE right HIP (WITH PELVIS IF PERFORMED) 3 VIEWS TECHNIQUE: Fluoroscopic spot image(s) were submitted for interpretation post-operatively. COMPARISON:  Radiographs of the  right hip 01/20/2021. FINDINGS: Three intraoperative fluoroscopic images of the right hip are submitted. On the provided images, there are findings of interval right total hip arthroplasty. The femoral and acetabular components appear well seated. No unexpected finding On the provided views. IMPRESSION: Three intraoperative fluoroscopic images of the right hip from right total hip arthroplasty, as described. Electronically Signed   By: Kellie Simmering DO   On: 02/24/2021 17:56    Disposition: Discharge disposition: 06-Home-Health Care Svc  Follow-up Information     Mcarthur Rossetti, MD. Schedule an appointment as soon as possible for a visit in 2 week(s).   Specialty: Orthopedic Surgery Contact information: [address] [place] [address] [phone]                  Signed: Erskine Emery 02/25/2021, 9:00 AM

## 2021-02-25 NOTE — Addendum Note (Signed)
Addendum  created 02/25/21 1634 by Eligha Bridegroom, CRNA   Intraprocedure Event edited

## 2021-02-25 NOTE — TOC Transition Note (Signed)
Transition of Care [facility]) - CM/SW Discharge Note   Patient Details  Name: Elijah Vargas MRN: [id number] Date of Birth: 27-Mar-1961  Transition of Care [facility] CM/SW Contact:  Verdell Carmine, RN Phone Number: 02/25/2021, 1:30 PM   Clinical Narrative:     Home with rolling walker self care. No HH orders by MD.   Final next level of care: Home/Self Care Barriers to Discharge: No Barriers Identified   Patient Goals and CMS Choice        Discharge Placement               Home Self care        Discharge Plan and Services                DME Arranged: Walker rolling DME Agency: [employer] Date DME Agency Contacted: 02/25/21 Time DME Agency Contacted: 1330 Representative spoke with at DME Agency: [facility] [facility] Interventions     Readmission Risk Interventions No flowsheet data found.

## 2021-02-25 NOTE — Telephone Encounter (Signed)
Called wife and informed

## 2021-02-25 NOTE — Discharge Instructions (Addendum)

## 2021-02-25 NOTE — Telephone Encounter (Signed)
Medication approved..pharmacy is filling now

## 2021-03-02 ENCOUNTER — Telehealth: Payer: Self-pay | Admitting: Orthopaedic Surgery

## 2021-03-02 ENCOUNTER — Other Ambulatory Visit: Payer: Self-pay | Admitting: Orthopaedic Surgery

## 2021-03-02 DIAGNOSIS — M1611 Unilateral primary osteoarthritis, right hip: Secondary | ICD-10-CM

## 2021-03-02 MED ORDER — OXYCODONE HCL 5 MG PO TABS: 5.0000 mg | ORAL_TABLET | Freq: Four times a day (QID) | ORAL | 0 refills | Status: DC | PRN

## 2021-03-02 NOTE — Telephone Encounter (Signed)
Please advise on medication refill.  

## 2021-03-02 NOTE — Telephone Encounter (Signed)
PT wife called and is wondering about PT for this pt. She would like to know if there is a PT place in [place]. Also refill on oxycodone.   CB [phone]

## 2021-03-03 ENCOUNTER — Telehealth: Payer: Self-pay | Admitting: Orthopaedic Surgery

## 2021-03-03 ENCOUNTER — Other Ambulatory Visit: Payer: Self-pay | Admitting: Orthopaedic Surgery

## 2021-03-03 MED ORDER — OXYCODONE HCL 5 MG PO TABS: 5.0000 mg | ORAL_TABLET | Freq: Four times a day (QID) | ORAL | 0 refills | Status: AC | PRN

## 2021-03-03 NOTE — Addendum Note (Signed)
Addended by: Robyne Peers on: 03/03/2021 04:44 PM   Modules accepted: Orders

## 2021-03-03 NOTE — Telephone Encounter (Signed)
Received call from patient's wife Velta Addison she advised the Rx for Oxycodone was sent to the wrong pharmacy. She advised the Rx should have been sent to the [facility] [place] (in [employer])  Velta Addison asked if the other pharmacy can be removed from chart. The number to contact patient is [phone]

## 2021-03-03 NOTE — Telephone Encounter (Signed)
Referral placed in chart. Lvm informing pt of above

## 2021-03-10 ENCOUNTER — Encounter: Payer: Self-pay | Admitting: Orthopaedic Surgery

## 2021-03-10 ENCOUNTER — Ambulatory Visit (INDEPENDENT_AMBULATORY_CARE_PROVIDER_SITE_OTHER): Payer: BC Managed Care – PPO | Admitting: Orthopaedic Surgery

## 2021-03-10 ENCOUNTER — Other Ambulatory Visit: Payer: Self-pay

## 2021-03-10 DIAGNOSIS — Z96641 Presence of right artificial hip joint: Secondary | ICD-10-CM

## 2021-03-10 NOTE — Progress Notes (Signed)
The patient comes in today for his 2-week postoperative visit status post a right total hip arthroplasty.  He is doing well overall.  He has been having some hot flashes but does report increased range of motion and strength.  He is only taking a baby aspirin twice a day and some Tylenol as well as Robaxin.  He is off narcotics.  He is ambulating with a cane but not using it at home much.  His right hip incision looks good.  We remove the staples in place Steri-Strips.  There was only about 25 cc of the seroma was aspirated from the hip.  His leg lengths are equal.  He will continue to increase his activity as comfort allows.  I will see him back in 4 weeks to see how his mobility is coming along but no x-rays are needed.

## 2021-03-12 ENCOUNTER — Telehealth: Payer: Self-pay | Admitting: Orthopaedic Surgery

## 2021-03-12 NOTE — Telephone Encounter (Signed)
Called patient left message to return call to schedule a post op appointment with Dr Ninfa Linden or Artis Delay. Note: Patient was seen 03/10/2021

## 2021-03-31 ENCOUNTER — Other Ambulatory Visit: Payer: Self-pay | Admitting: Physician Assistant

## 2021-04-07 ENCOUNTER — Ambulatory Visit (INDEPENDENT_AMBULATORY_CARE_PROVIDER_SITE_OTHER): Payer: BC Managed Care – PPO | Admitting: Orthopaedic Surgery

## 2021-04-07 ENCOUNTER — Other Ambulatory Visit: Payer: Self-pay

## 2021-04-07 ENCOUNTER — Encounter: Payer: Self-pay | Admitting: Orthopaedic Surgery

## 2021-04-07 DIAGNOSIS — Z96641 Presence of right artificial hip joint: Secondary | ICD-10-CM

## 2021-04-07 MED ORDER — DICLOFENAC SODIUM 1 % EX GEL: 4.0000 g | Freq: Four times a day (QID) | CUTANEOUS | 1 refills | Status: AC

## 2021-04-07 NOTE — Progress Notes (Signed)
HPI: 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  He is overall doing well.  Just feels pain in the groin and lateral aspect of the hip.  He is doing abduction exercises with some other stretching exercises.  He has some achiness when going up and down stairs.  Feels clinic of lateral aspect of the hip at times.  Also has question about bilateral foot pain and seeing podiatrist for.  Physical exam: Right hip excellent range of motion slightly limited external rotation.  Surgical incisions well-healed.  Calf supple nontender ambulates without any assistive device.  Slight tenderness over the right hip trochanteric region Bilateral feet left foot he has some tenderness in the metatarsal head area.  There is no rashes skin lesions ulcerations he is pes planus foot.  Gastroc tight on the left.  Mulder's click is present.  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  Discontinue abduction exercises.  IT band stretching exercises were shown.  Also work on gastrocsoleus stretching exercises shown.  Discussed shoe wear with him.  He needs an wide toe box and also needs to avoid open back shoes.  He will apply Voltaren gel metatarsal head region both feet up to 4 g 4 times daily.  He will follow-up with [place] in 1 month to see how he is doing overall.  Questions encouraged and answered at length.

## 2021-05-06 ENCOUNTER — Ambulatory Visit (INDEPENDENT_AMBULATORY_CARE_PROVIDER_SITE_OTHER): Payer: BC Managed Care – PPO | Admitting: Orthopaedic Surgery

## 2021-05-06 ENCOUNTER — Ambulatory Visit (INDEPENDENT_AMBULATORY_CARE_PROVIDER_SITE_OTHER): Payer: BC Managed Care – PPO

## 2021-05-06 DIAGNOSIS — Z96641 Presence of right artificial hip joint: Secondary | ICD-10-CM | POA: Diagnosis not present

## 2021-05-06 NOTE — Progress Notes (Signed)
HPI: [name] returns today for follow-up of his right hip.  He states the hip is doing well.  He has some tightness and slight discomfort but otherwise doing good.  Denies report hearing a clunking and clicking of the hip yesterday while going up a steep incline.  He has had no injury to the hip.  Otherwise he is very happy with the hip replacement.  Review of systems: See HPI  Physical exam: Right hip fluid range of motion without pain.  Transfers on and off the exam table on his own.  Ambulates without any assistive device and a nonantalgic gait.  Radiographs: AP pelvis shows the right hip to be well located.  There is no evidence of loosening or hardware failure.  No acute fractures.  No acute findings.   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  Reassurance is given that the x-ray showed that he had to be well located no complication.  Questions were encouraged and answered by Dr. Ninfa Vargas and myself.  Follow-up with [place] in 6 months for AP pelvis lateral view of the right hip.  Sooner if there is any questions or concerns.

## 2022-05-11 IMAGING — RF DG C-ARM 1-60 MIN
1 series · 3 of 3 positions shown · non-contrast
Comparison: Radiographs of the right hip 01/20/2021.

CLINICAL DATA: Surgery, elective. Additional history provided:
Right anterior total hip arthroplasty. Provided fluoroscopy time 24
seconds (3.04 mGy).

EXAM:
OPERATIVE right HIP (WITH PELVIS IF PERFORMED) 3 VIEWS
TECHNIQUE: Fluoroscopic spot image(s) were submitted for interpretation
post-operatively.

[Series 1: unknown protocol · 0.20mm/px · 3 of 3 slices shown]
[im 1/3]
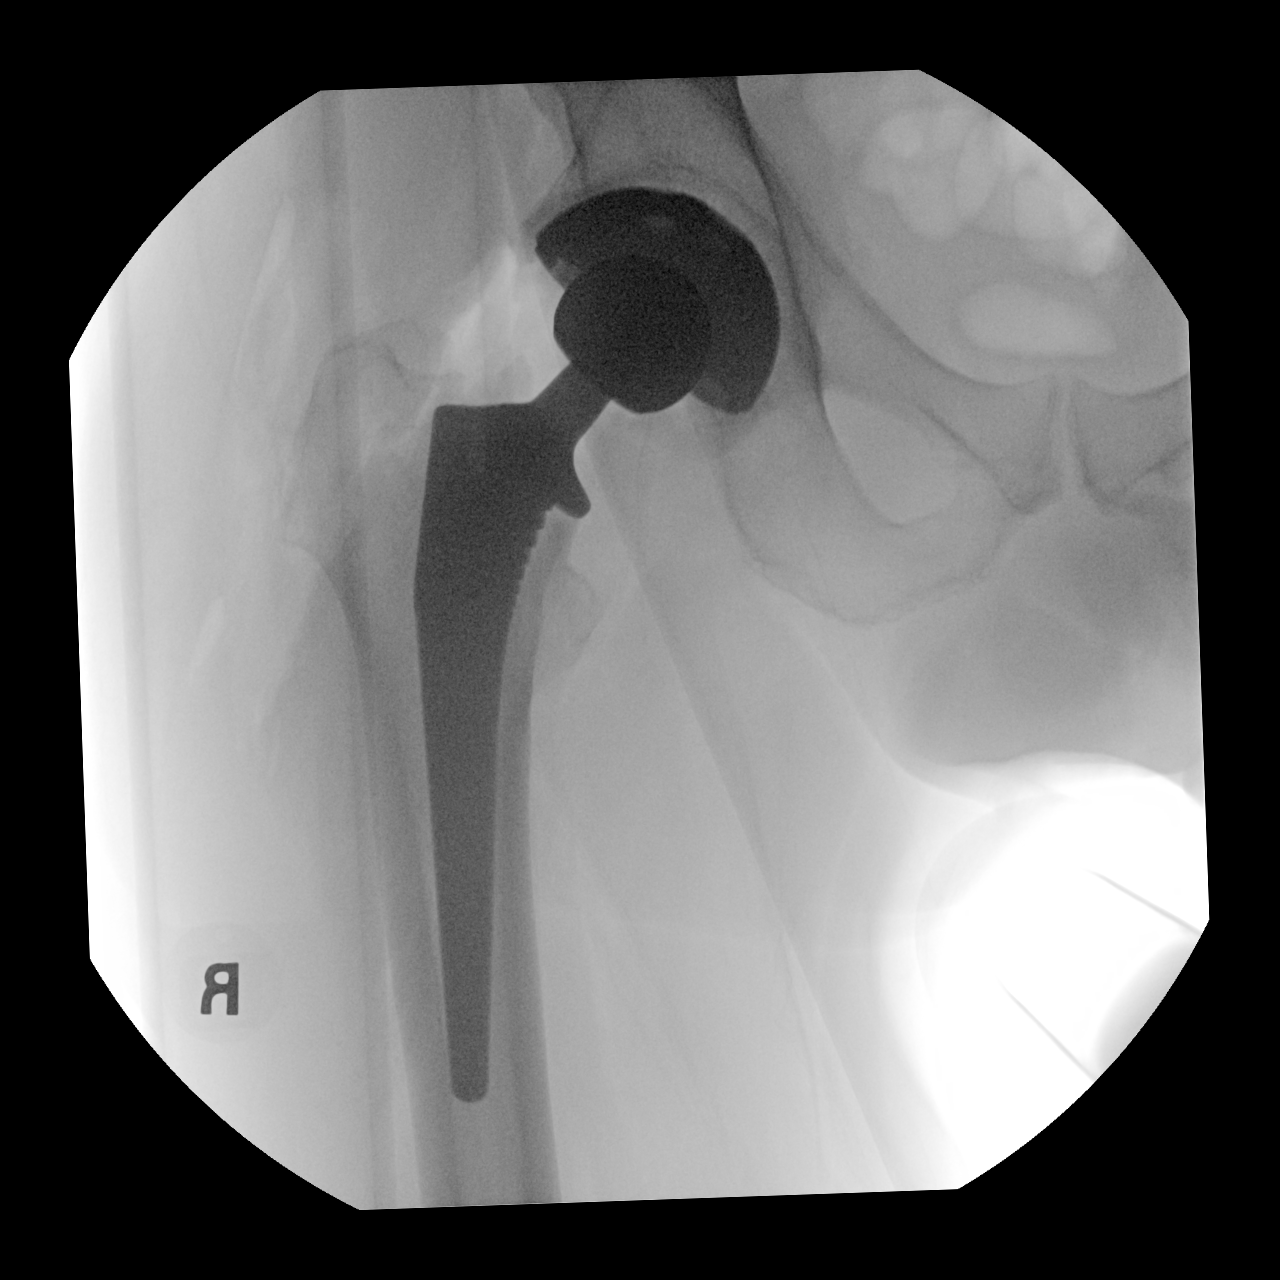
[im 2/3]
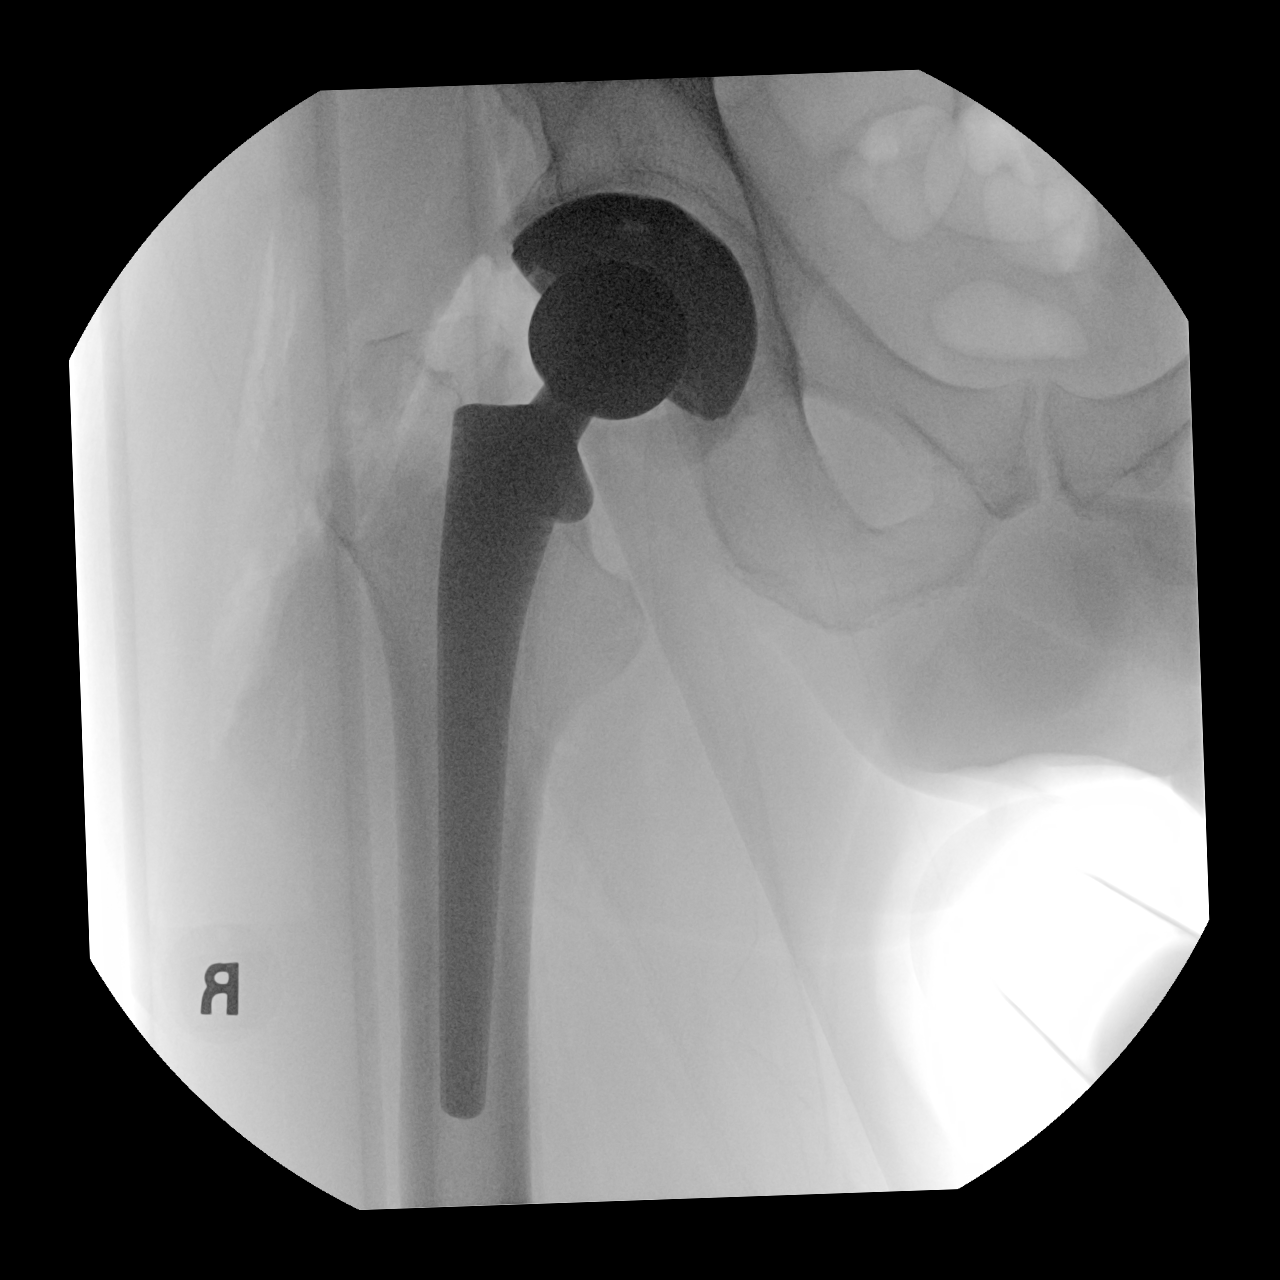
[im 3/3]
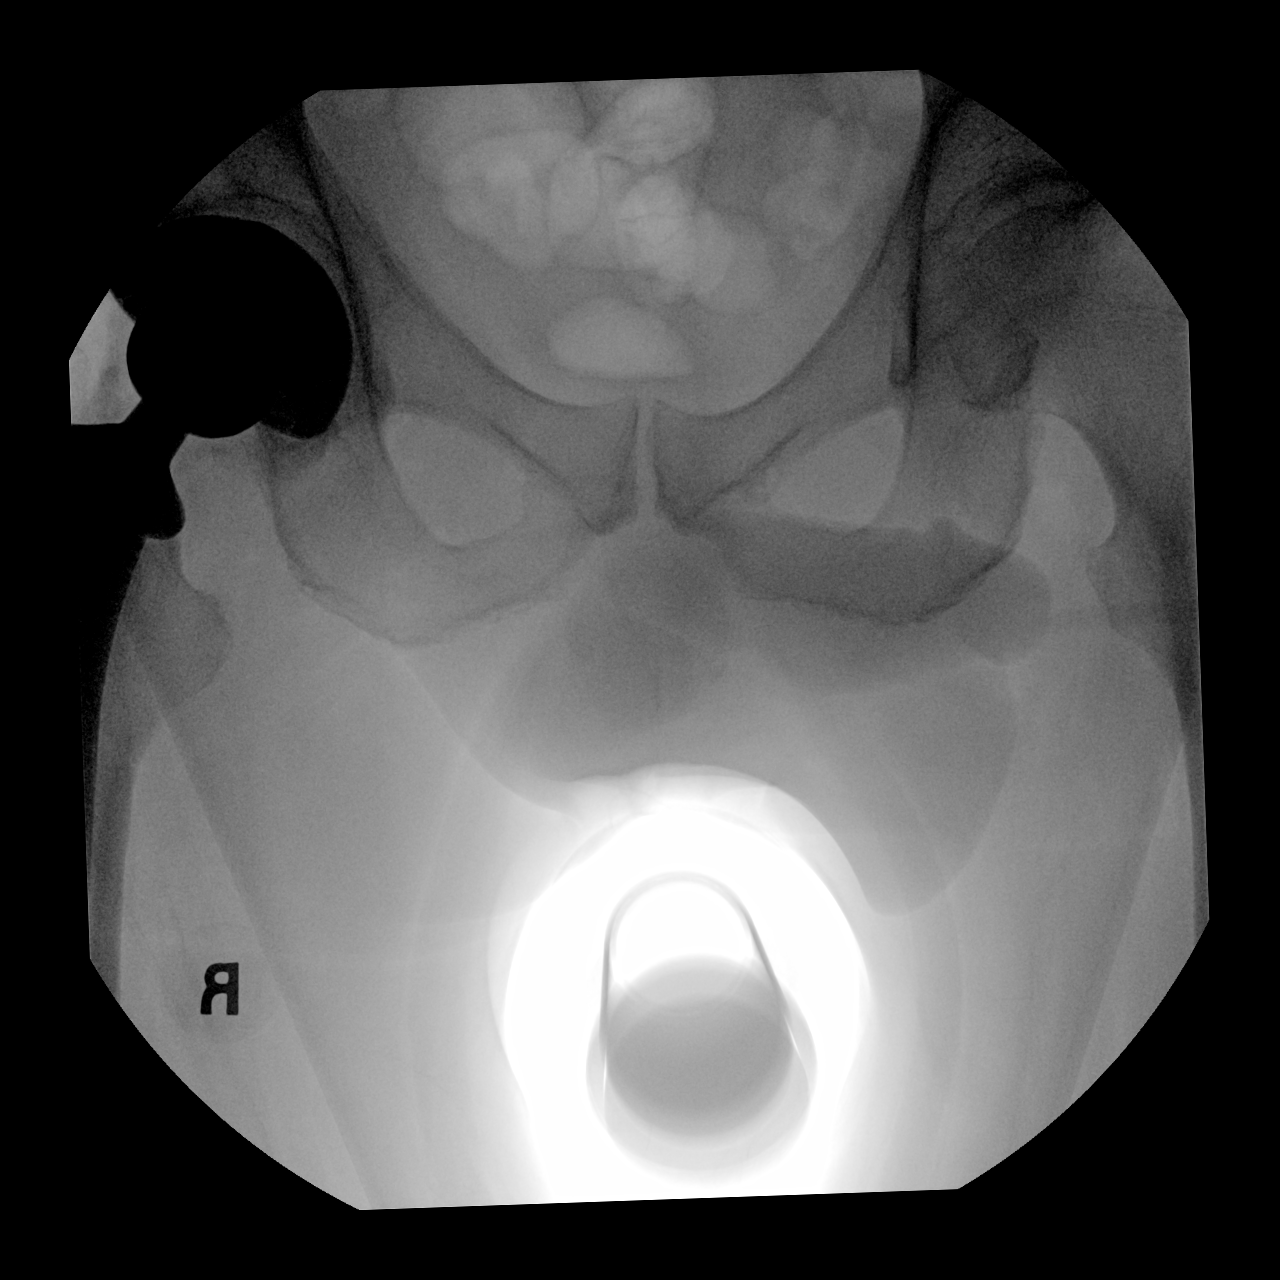

[3 of 3 positions shown; findings below may reference images not displayed]

FINDINGS: Three intraoperative fluoroscopic images of the right hip are
submitted. On the provided images, there are findings of interval
right total hip arthroplasty. The femoral and acetabular components
appear well seated. No unexpected finding

On the provided views.
IMPRESSION: Three intraoperative fluoroscopic images of the right hip from right
total hip arthroplasty, as described.

## 2022-05-11 IMAGING — DX DG PORTABLE PELVIS
1 series · 1 of 1 positions shown · non-contrast
Comparison: Intraoperative films from earlier in the same day.

CLINICAL DATA: Status post right hip replacement

EXAM:
PORTABLE PELVIS 1 VIEWS

[pelvis]
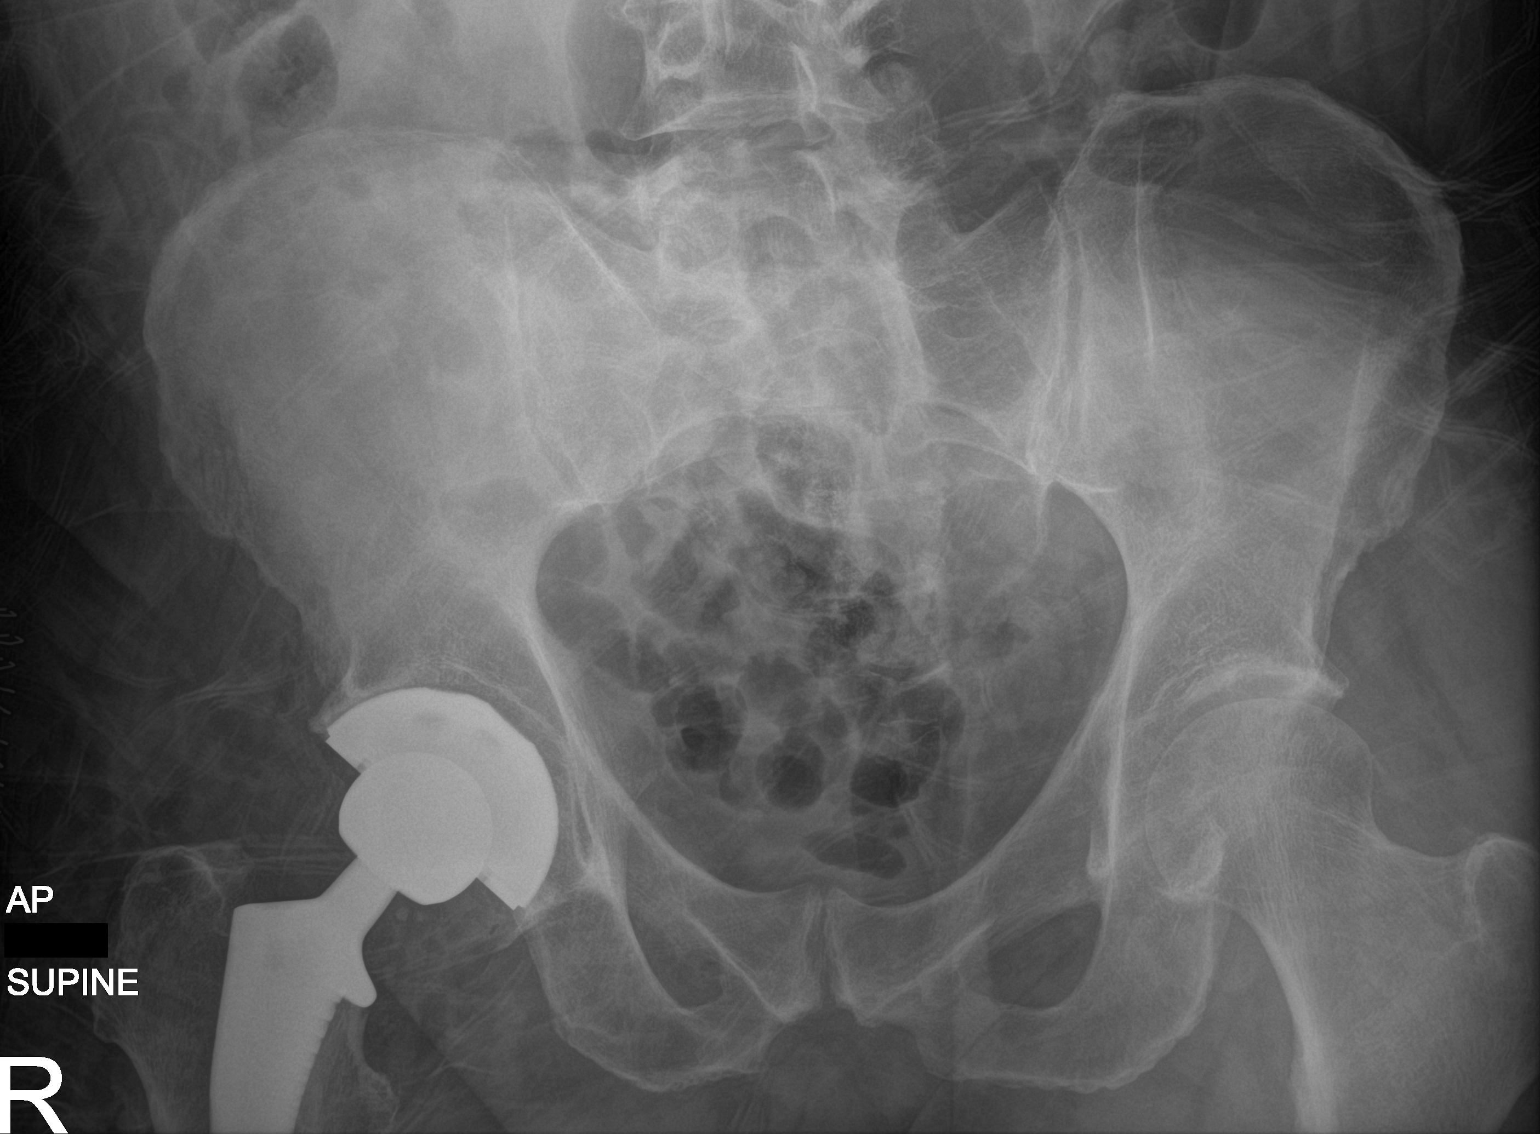

[1 of 1 positions shown; findings below may reference images not displayed]

FINDINGS: Right hip prosthesis is noted in satisfactory position. Pelvic ring
is intact. No acute bony or soft tissue abnormality is seen.
IMPRESSION: Status post right hip replacement.
